# Patient Record
Sex: Female | Born: 1970 | Race: Black or African American | Hispanic: No | Marital: Single | Smoking: Never smoker
Health system: Southern US, Community
[De-identification: ages and names within clinical notes are randomized; demographics above are authoritative.]

## PROBLEM LIST (undated history)

## (undated) DIAGNOSIS — I10 Essential (primary) hypertension: Secondary | ICD-10-CM

## (undated) DIAGNOSIS — D649 Anemia, unspecified: Secondary | ICD-10-CM

## (undated) DIAGNOSIS — E119 Type 2 diabetes mellitus without complications: Secondary | ICD-10-CM

## (undated) HISTORY — PX: ABDOMINAL SURGERY: SHX537

---

## 1998-01-05 ENCOUNTER — Emergency Department (HOSPITAL_COMMUNITY): Admission: EM | Admit: 1998-01-05 | Discharge: 1998-01-05 | Payer: Self-pay | Admitting: Emergency Medicine

## 1998-01-12 ENCOUNTER — Encounter (HOSPITAL_COMMUNITY): Admission: RE | Admit: 1998-01-12 | Discharge: 1998-04-12 | Payer: Self-pay

## 2006-07-24 ENCOUNTER — Other Ambulatory Visit: Admission: RE | Admit: 2006-07-24 | Discharge: 2006-07-24 | Payer: Self-pay | Admitting: Family Medicine

## 2006-11-06 ENCOUNTER — Other Ambulatory Visit (HOSPITAL_COMMUNITY): Admission: RE | Admit: 2006-11-06 | Discharge: 2007-02-04 | Payer: Self-pay | Admitting: Psychiatry

## 2006-11-06 ENCOUNTER — Ambulatory Visit: Payer: Self-pay | Admitting: Psychiatry

## 2012-03-21 ENCOUNTER — Emergency Department (HOSPITAL_BASED_OUTPATIENT_CLINIC_OR_DEPARTMENT_OTHER): Payer: Self-pay

## 2012-03-21 ENCOUNTER — Emergency Department (HOSPITAL_BASED_OUTPATIENT_CLINIC_OR_DEPARTMENT_OTHER)
Admission: EM | Admit: 2012-03-21 | Discharge: 2012-03-21 | Disposition: A | Discharge status: Home / Self Care | Payer: Self-pay | Attending: Emergency Medicine | Admitting: Emergency Medicine

## 2012-03-21 ENCOUNTER — Encounter (HOSPITAL_BASED_OUTPATIENT_CLINIC_OR_DEPARTMENT_OTHER): Payer: Self-pay

## 2012-03-21 DIAGNOSIS — A499 Bacterial infection, unspecified: Secondary | ICD-10-CM | POA: Insufficient documentation

## 2012-03-21 DIAGNOSIS — B9689 Other specified bacterial agents as the cause of diseases classified elsewhere: Secondary | ICD-10-CM | POA: Insufficient documentation

## 2012-03-21 DIAGNOSIS — N760 Acute vaginitis: Secondary | ICD-10-CM | POA: Insufficient documentation

## 2012-03-21 DIAGNOSIS — I10 Essential (primary) hypertension: Secondary | ICD-10-CM | POA: Insufficient documentation

## 2012-03-21 HISTORY — DX: Essential (primary) hypertension: I10

## 2012-03-21 HISTORY — DX: Anemia, unspecified: D64.9

## 2012-03-21 LAB — COMPREHENSIVE METABOLIC PANEL
ALT: 5 U/L (ref 0–35)
Alkaline Phosphatase: 67 U/L (ref 39–117)
CO2: 26 mEq/L (ref 19–32)
Calcium: 9.4 mg/dL (ref 8.4–10.5)
Chloride: 103 mEq/L (ref 96–112)
GFR calc Af Amer: 89 mL/min — ABNORMAL LOW (ref >90)
GFR calc non Af Amer: 77 mL/min — ABNORMAL LOW (ref >90)
Glucose, Bld: 105 mg/dL — ABNORMAL HIGH (ref 70–99)
Sodium: 139 mEq/L (ref 135–145)
Total Bilirubin: 0.2 mg/dL — ABNORMAL LOW (ref 0.3–1.2)

## 2012-03-21 LAB — URINALYSIS, ROUTINE W REFLEX MICROSCOPIC
Glucose, UA: NEGATIVE mg/dL
Hgb urine dipstick: NEGATIVE
Specific Gravity, Urine: 1.028 (ref 1.005–1.030)
Urobilinogen, UA: 0.2 mg/dL (ref 0.0–1.0)

## 2012-03-21 LAB — CBC WITH DIFFERENTIAL/PLATELET
Eosinophils Relative: 1 % (ref 0–5)
HCT: 33.8 % — ABNORMAL LOW (ref 36.0–46.0)
Lymphocytes Relative: 17 % (ref 12–46)
Lymphs Abs: 2.1 10*3/uL (ref 0.7–4.0)
MCV: 74.1 fL — ABNORMAL LOW (ref 78.0–100.0)
Neutro Abs: 9.3 10*3/uL — ABNORMAL HIGH (ref 1.7–7.7)
Platelets: 420 10*3/uL — ABNORMAL HIGH (ref 150–400)
RBC: 4.56 MIL/uL (ref 3.87–5.11)
WBC: 12.5 10*3/uL — ABNORMAL HIGH (ref 4.0–10.5)

## 2012-03-21 LAB — WET PREP, GENITAL: Trich, Wet Prep: NONE SEEN

## 2012-03-21 LAB — URINE MICROSCOPIC-ADD ON

## 2012-03-21 MED ORDER — ONDANSETRON HCL 4 MG/2ML IJ SOLN
4.0000 mg | Freq: Once | INTRAMUSCULAR | Status: AC
  Administered 2012-03-21: 4 mg via INTRAVENOUS
  Filled 2012-03-21: qty 2

## 2012-03-21 MED ORDER — METRONIDAZOLE 500 MG PO TABS: 500.0000 mg | ORAL_TABLET | Freq: Two times a day (BID) | ORAL | Status: AC

## 2012-03-21 MED ORDER — IOHEXOL 300 MG/ML  SOLN: 20.0000 mL | INTRAMUSCULAR | Status: AC

## 2012-03-21 MED ORDER — METRONIDAZOLE 500 MG PO TABS
500.0000 mg | ORAL_TABLET | Freq: Once | ORAL | Status: AC
  Administered 2012-03-21: 500 mg via ORAL
  Filled 2012-03-21: qty 1

## 2012-03-21 MED ORDER — IOHEXOL 300 MG/ML  SOLN
100.0000 mL | Freq: Once | INTRAMUSCULAR | Status: AC | PRN
  Administered 2012-03-21: 100 mL via INTRAVENOUS

## 2012-03-21 NOTE — ED Notes (Signed)
IV attempt x2 without success.

## 2012-03-21 NOTE — ED Notes (Signed)
Patient transported to Ultrasound 

## 2012-03-21 NOTE — ED Notes (Signed)
Pt reports abdominal pain for 6 months and worsening the past week.  She also reports vaginal d/c and odor.

## 2012-03-21 NOTE — ED Notes (Signed)
Given snack and drink  

## 2012-03-22 LAB — GC/CHLAMYDIA PROBE AMP, GENITAL
Chlamydia, DNA Probe: NEGATIVE
GC Probe Amp, Genital: NEGATIVE
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
## 2013-01-19 IMAGING — US US TRANSVAGINAL NON-OB
1 series · 14 of 25 positions shown · non-contrast
Comparison: CT of the abdomen pelvis 03/21/2012

CLINICAL DATA: Lower abdominal tenderness.  CT showing a cervical
and lower vaginal lesion 1 year ago.  History of abnormal Pap
smears. 03/17/2012 LMP



[Series 1: us transvaginal non-ob · 0.30mm/px · 14 of 94 slices shown]
[im 1/94]
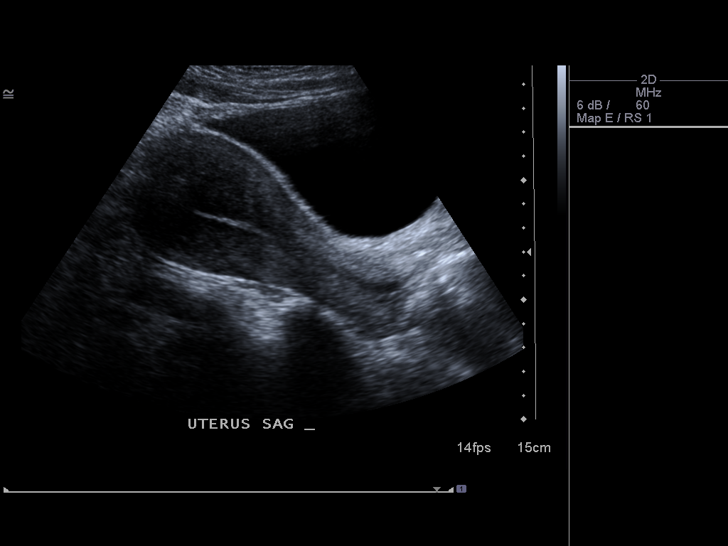
[im 8/94]
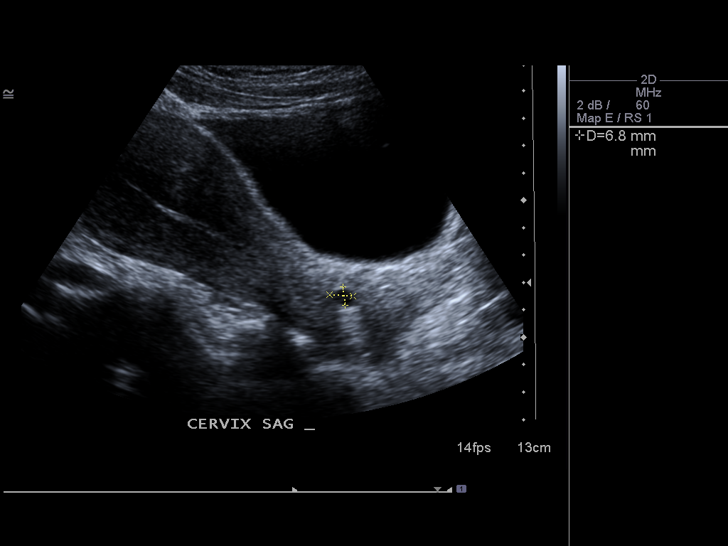
[im 16/94]
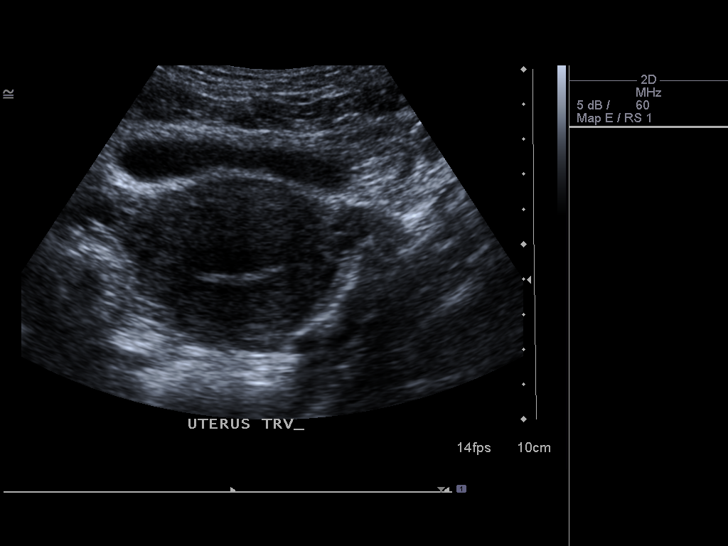
[im 24/94]
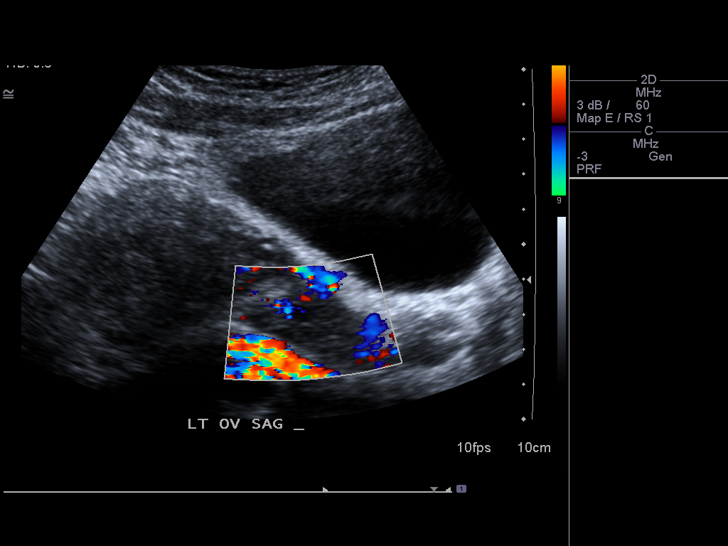
[im 32/94]
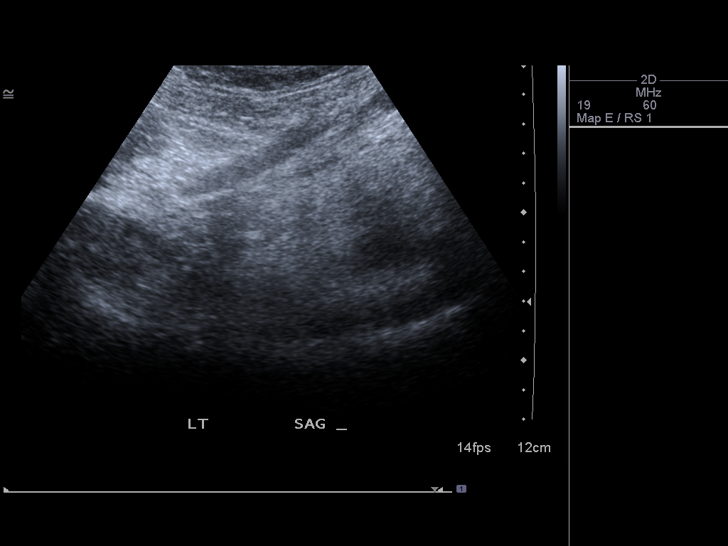
[im 35/94]
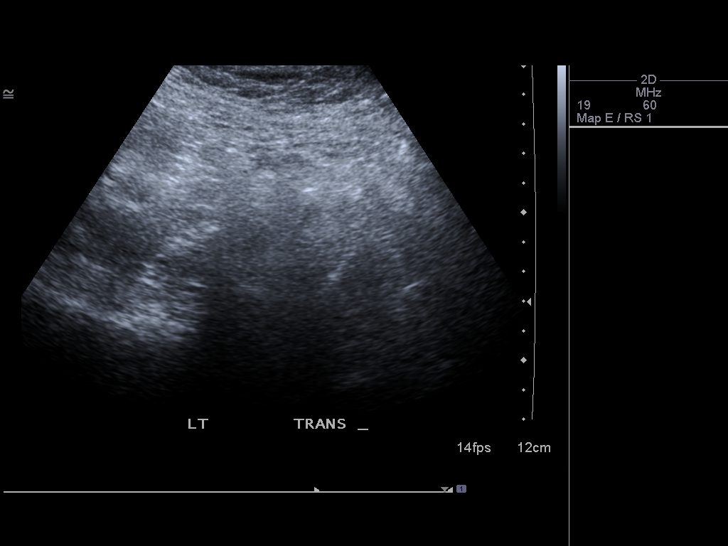
[im 43/94]
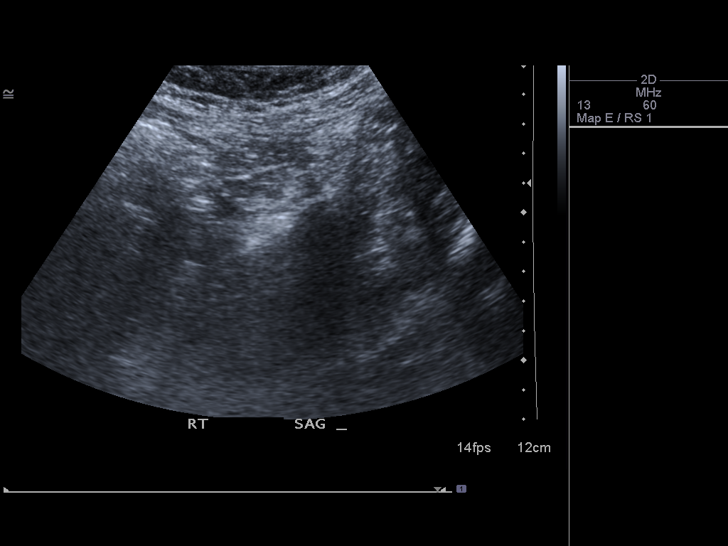
[im 51/94]
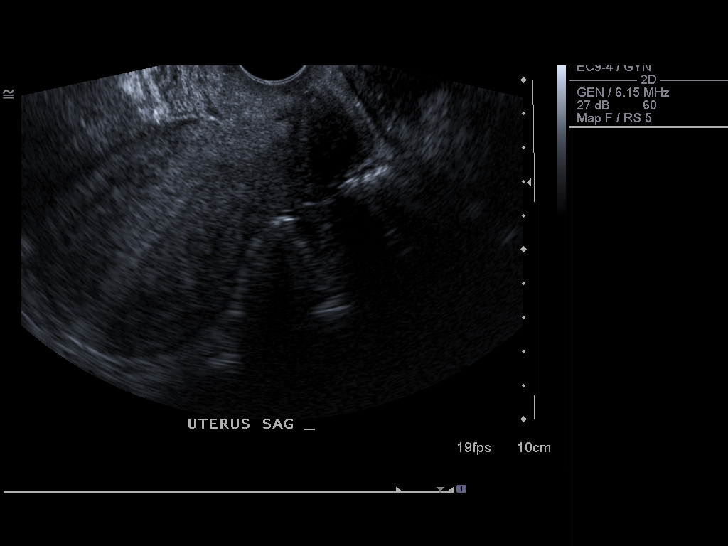
[im 59/94]
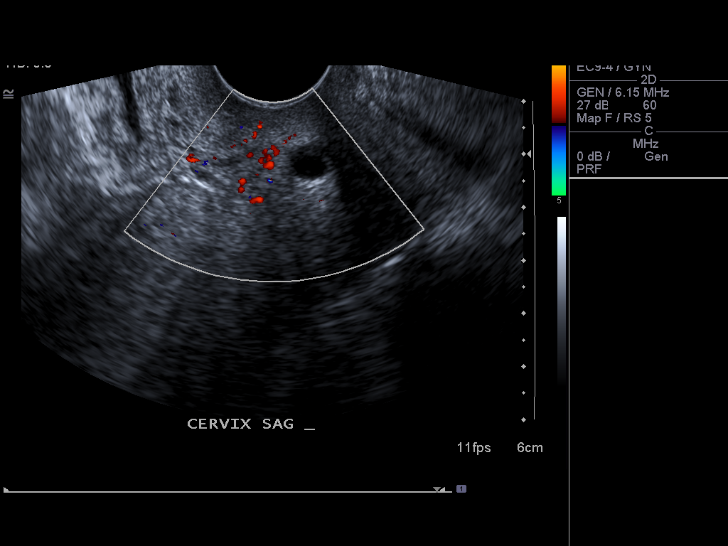
[im 63/94]
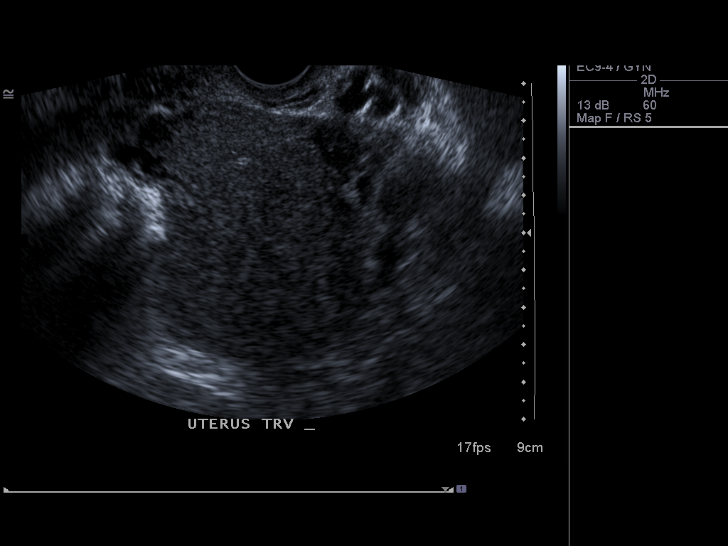
[im 70/94]
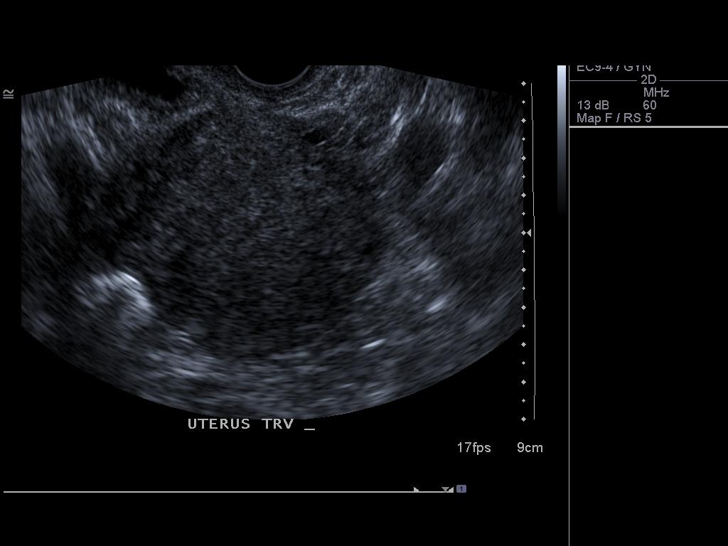
[im 78/94]
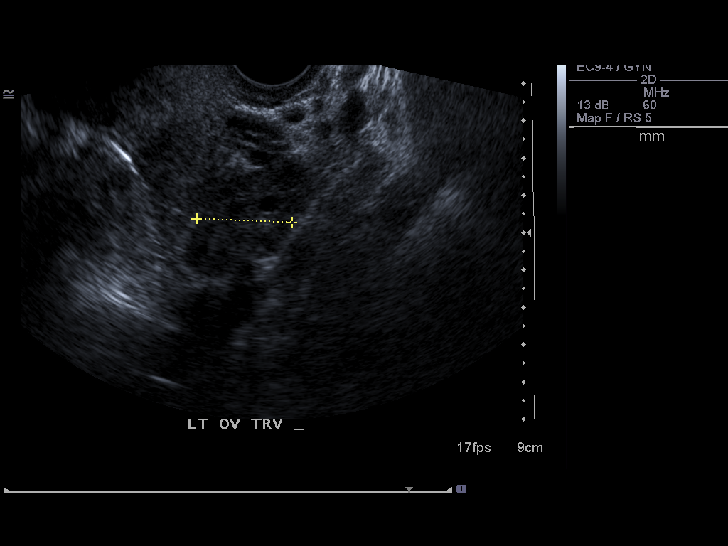
[im 86/94]
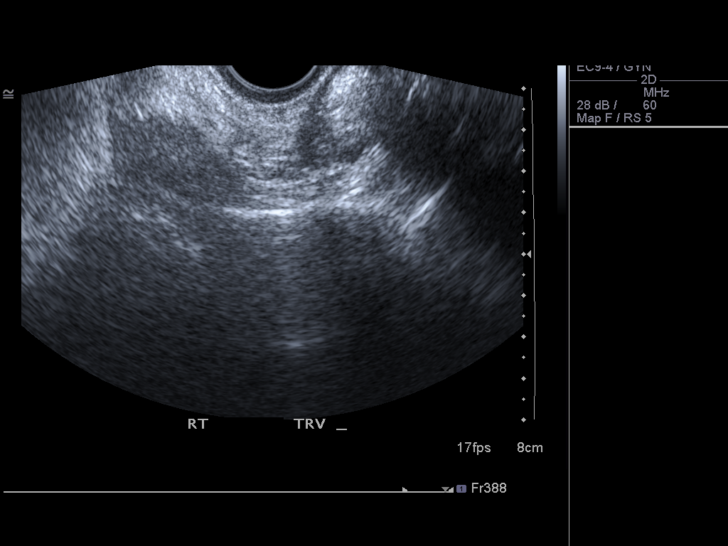
[im 94/94]
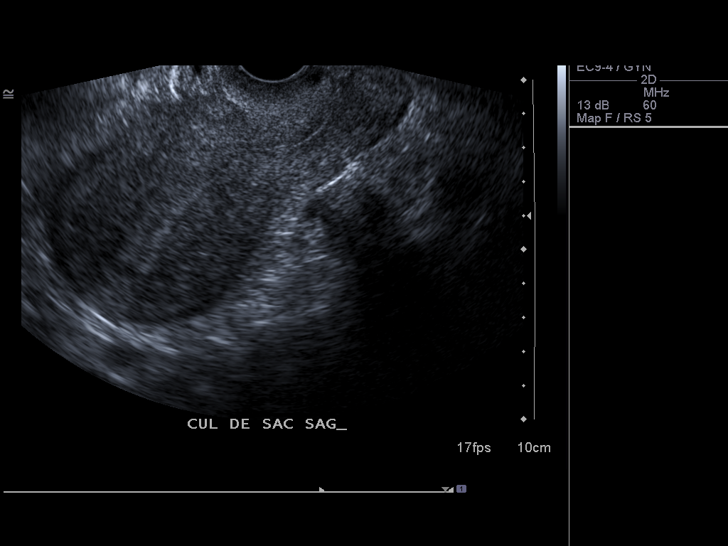

[14 of 25 positions shown; findings below may reference images not displayed]

FINDINGS: Uterus: 11.1 x 5.4 x 6.4 cm.  Normal appearance.  Grossly, the
cervix has a normal appearance with incidental note of a small
Nabothian cyst.

Endometrium: Normal appearance, 1.1 mm in thickness.

Right ovary:  4.2 x 2.1 x 3.4 cm. Normal appearance.

Left ovary: 3.4 x 1.8 x 1.9 cm. Normal appearance.

Other findings: No free fluid
IMPRESSION: 1.  Normal appearance of the uterus/cervix.
2.  Incidental note of benign Nabothian cyst in the cervix.
2.  Normal appearance of both ovaries..

## 2022-03-30 ENCOUNTER — Other Ambulatory Visit: Payer: Self-pay

## 2022-03-30 ENCOUNTER — Emergency Department (HOSPITAL_BASED_OUTPATIENT_CLINIC_OR_DEPARTMENT_OTHER)
Admission: EM | Admit: 2022-03-30 | Discharge: 2022-03-30 | Disposition: A | Discharge status: Home / Self Care | Payer: No Typology Code available for payment source | Attending: Emergency Medicine | Admitting: Emergency Medicine

## 2022-03-30 ENCOUNTER — Encounter (HOSPITAL_BASED_OUTPATIENT_CLINIC_OR_DEPARTMENT_OTHER): Payer: Self-pay

## 2022-03-30 DIAGNOSIS — R739 Hyperglycemia, unspecified: Secondary | ICD-10-CM | POA: Diagnosis not present

## 2022-03-30 DIAGNOSIS — Z79899 Other long term (current) drug therapy: Secondary | ICD-10-CM | POA: Insufficient documentation

## 2022-03-30 DIAGNOSIS — R944 Abnormal results of kidney function studies: Secondary | ICD-10-CM | POA: Diagnosis not present

## 2022-03-30 DIAGNOSIS — D72829 Elevated white blood cell count, unspecified: Secondary | ICD-10-CM | POA: Diagnosis not present

## 2022-03-30 DIAGNOSIS — I10 Essential (primary) hypertension: Secondary | ICD-10-CM | POA: Diagnosis not present

## 2022-03-30 DIAGNOSIS — R519 Headache, unspecified: Secondary | ICD-10-CM | POA: Diagnosis present

## 2022-03-30 LAB — URINALYSIS, MICROSCOPIC (REFLEX)

## 2022-03-30 LAB — CBC WITH DIFFERENTIAL/PLATELET
Abs Immature Granulocytes: 0.11 10*3/uL — ABNORMAL HIGH (ref 0.00–0.07)
Basophils Absolute: 0.1 10*3/uL (ref 0.0–0.1)
Basophils Relative: 0 %
Eosinophils Absolute: 0.4 10*3/uL (ref 0.0–0.5)
Eosinophils Relative: 3 %
HCT: 37.1 % (ref 36.0–46.0)
Hemoglobin: 11.3 g/dL — ABNORMAL LOW (ref 12.0–15.0)
Immature Granulocytes: 1 %
Lymphocytes Relative: 18 %
Lymphs Abs: 2.4 10*3/uL (ref 0.7–4.0)
MCH: 20.9 pg — ABNORMAL LOW (ref 26.0–34.0)
MCHC: 30.5 g/dL (ref 30.0–36.0)
MCV: 68.6 fL — ABNORMAL LOW (ref 80.0–100.0)
Monocytes Absolute: 1 10*3/uL (ref 0.1–1.0)
Monocytes Relative: 7 %
Neutro Abs: 9.9 10*3/uL — ABNORMAL HIGH (ref 1.7–7.7)
Neutrophils Relative %: 71 %
Platelets: 414 10*3/uL — ABNORMAL HIGH (ref 150–400)
RBC: 5.41 MIL/uL — ABNORMAL HIGH (ref 3.87–5.11)
RDW: 17.6 % — ABNORMAL HIGH (ref 11.5–15.5)
WBC: 13.8 10*3/uL — ABNORMAL HIGH (ref 4.0–10.5)
nRBC: 0 % (ref 0.0–0.2)

## 2022-03-30 LAB — COMPREHENSIVE METABOLIC PANEL
ALT: 11 U/L (ref 0–44)
AST: 22 U/L (ref 15–41)
Albumin: 3.7 g/dL (ref 3.5–5.0)
Alkaline Phosphatase: 138 U/L — ABNORMAL HIGH (ref 38–126)
Anion gap: 11 (ref 5–15)
BUN: 21 mg/dL — ABNORMAL HIGH (ref 6–20)
CO2: 24 mmol/L (ref 22–32)
Calcium: 9.2 mg/dL (ref 8.9–10.3)
Chloride: 96 mmol/L — ABNORMAL LOW (ref 98–111)
Creatinine, Ser: 1.22 mg/dL — ABNORMAL HIGH (ref 0.44–1.00)
GFR, Estimated: 53 mL/min — ABNORMAL LOW (ref >60)
Glucose, Bld: 446 mg/dL — ABNORMAL HIGH (ref 70–99)
Potassium: 3.7 mmol/L (ref 3.5–5.1)
Sodium: 131 mmol/L — ABNORMAL LOW (ref 135–145)
Total Bilirubin: 0.4 mg/dL (ref 0.3–1.2)
Total Protein: 8.2 g/dL — ABNORMAL HIGH (ref 6.5–8.1)

## 2022-03-30 LAB — I-STAT VENOUS BLOOD GAS, ED
Acid-Base Excess: 2 mmol/L (ref 0.0–2.0)
Bicarbonate: 26.6 mmol/L (ref 20.0–28.0)
Calcium, Ion: 1.19 mmol/L (ref 1.15–1.40)
HCT: 38 % (ref 36.0–46.0)
Hemoglobin: 12.9 g/dL (ref 12.0–15.0)
O2 Saturation: 98 %
Potassium: 3.8 mmol/L (ref 3.5–5.1)
Sodium: 134 mmol/L — ABNORMAL LOW (ref 135–145)
TCO2: 28 mmol/L (ref 22–32)
pCO2, Ven: 42.1 mmHg — ABNORMAL LOW (ref 44–60)
pH, Ven: 7.409 (ref 7.25–7.43)
pO2, Ven: 106 mmHg — ABNORMAL HIGH (ref 32–45)

## 2022-03-30 LAB — URINALYSIS, ROUTINE W REFLEX MICROSCOPIC
Bilirubin Urine: NEGATIVE
Glucose, UA: >=500 mg/dL — AB
Ketones, ur: NEGATIVE mg/dL
Leukocytes,Ua: NEGATIVE
Nitrite: NEGATIVE
Protein, ur: NEGATIVE mg/dL
Specific Gravity, Urine: 1.015 (ref 1.005–1.030)
pH: 6 (ref 5.0–8.0)

## 2022-03-30 LAB — CBG MONITORING, ED
Glucose-Capillary: 426 mg/dL — ABNORMAL HIGH (ref 70–99)
Glucose-Capillary: 303 mg/dL — ABNORMAL HIGH (ref 70–99)

## 2022-03-30 LAB — BETA-HYDROXYBUTYRIC ACID: Beta-Hydroxybutyric Acid: 0.11 mmol/L (ref 0.05–0.27)

## 2022-03-30 MED ORDER — INSULIN ASPART 100 UNIT/ML IJ SOLN
8.0000 [IU] | Freq: Once | INTRAMUSCULAR | Status: AC
  Administered 2022-03-30: 8 [IU] via SUBCUTANEOUS

## 2022-03-30 MED ORDER — LISINOPRIL 10 MG PO TABS: 10.0000 mg | ORAL_TABLET | Freq: Every day | ORAL | 0 refills | Status: DC

## 2022-03-30 MED ORDER — METFORMIN HCL 500 MG PO TABS: 500.0000 mg | ORAL_TABLET | Freq: Two times a day (BID) | ORAL | 0 refills | Status: DC

## 2022-03-30 MED ORDER — LISINOPRIL 10 MG PO TABS
10.0000 mg | ORAL_TABLET | Freq: Once | ORAL | Status: AC
  Administered 2022-03-30: 10 mg via ORAL
  Filled 2022-03-30: qty 1

## 2022-03-30 MED ORDER — SODIUM CHLORIDE 0.9 % IV BOLUS
1000.0000 mL | Freq: Once | INTRAVENOUS | Status: AC
  Administered 2022-03-30: 1000 mL via INTRAVENOUS

## 2022-03-30 NOTE — Discharge Instructions (Addendum)
You were seen in the emergency department for elevated blood sugar.  Your blood pressure was also elevated.  We are starting you back on your blood pressure medication and add new diabetes medicine.  Please contact your primary care doctor for close follow-up.  Return to the emergency department if any worsening or concerning symptoms.

## 2022-03-30 NOTE — ED Triage Notes (Signed)
Patient states "I havent been feeling well so I used by friends glucometer and it read HI" Patient c/o headaches and abd pain.  BS in triage was 426

## 2022-03-30 NOTE — ED Provider Notes (Signed)
[facility] EMERGENCY DEPARTMENT Provider Note   CSN: [id number] Arrival date & time: 03/30/22  1042     History  Chief Complaint  Patient presents with   Hyperglycemia    Morgan May is a 51 y.o. female.  She has a history of hypertension but has not taken her blood pressure medication in over 3 months.  She has been noticing increased thirst and urinating a lot, getting frequent yeast infections.  She checked her blood sugar and found it to be high.  She said she had gestational diabetes and has a family history of diabetes.  She does not have a primary care doctor.  She has been trying some herbal treatment for managing her weight.  She does endorse having a headache today.  The history is provided by the patient.  Hyperglycemia Blood sugar level PTA:  High Severity:  Severe Onset quality:  Gradual Progression:  Unchanged Chronicity:  New Diabetes status:  Non-diabetic Context: new diabetes diagnosis   Relieved by:  None tried Ineffective treatments:  None tried Associated symptoms: dehydration, fatigue, increased appetite, increased thirst and polyuria   Associated symptoms: no abdominal pain, no chest pain, no dysuria and no shortness of breath   Risk factors: family hx of diabetes        Home Medications Prior to Admission medications   Medication Sig Start Date End Date Taking? Authorizing Provider  lisinopril (PRINIVIL,ZESTRIL) 10 MG tablet Take 10 mg by mouth daily.    [provider]  naproxen sodium (ANAPROX) 220 MG tablet Take 440 mg by mouth 2 (two) times daily with a meal. For pain.    [provider]      Allergies    Patient has no known allergies.    Review of Systems   Review of Systems  Constitutional:  Positive for fatigue.  HENT:  Negative for sore throat.   Eyes:  Negative for visual disturbance.  Respiratory:  Negative for shortness of breath.   Cardiovascular:  Negative for chest pain.  Gastrointestinal:   Negative for abdominal pain.  Endocrine: Positive for polydipsia and polyuria.  Genitourinary:  Negative for dysuria.  Neurological:  Positive for headaches.    Physical Exam Updated Vital Signs BP (!) 209/136 (BP Location: Left Arm)   Pulse (!) 106   Temp 98 F (36.7 C) (Oral)   Resp 17   Ht 6' (1.829 m)   Wt 105.4 kg   LMP 02/22/2022   SpO2 95%   BMI 31.51 kg/m  Physical Exam Vitals and nursing note reviewed.  Constitutional:      General: She is not in acute distress.    Appearance: Normal appearance. She is well-developed.  HENT:     Head: Normocephalic and atraumatic.  Eyes:     Conjunctiva/sclera: Conjunctivae normal.  Cardiovascular:     Rate and Rhythm: Normal rate and regular rhythm.     Heart sounds: No murmur heard. Pulmonary:     Effort: Pulmonary effort is normal. No respiratory distress.     Breath sounds: Normal breath sounds.  Abdominal:     Palpations: Abdomen is soft.     Tenderness: There is no abdominal tenderness. There is no guarding or rebound.  Musculoskeletal:        General: Normal range of motion.     Cervical back: Neck supple.  Skin:    General: Skin is warm and dry.     Capillary Refill: Capillary refill takes less than 2 seconds.  Neurological:  General: No focal deficit present.     Mental Status: She is alert and oriented to person, place, and time.     Cranial Nerves: No cranial nerve deficit.     Sensory: No sensory deficit.     Motor: No weakness.     Gait: Gait normal.     ED Results / Procedures / Treatments   Labs (all labs ordered are listed, but only abnormal results are displayed) Labs Reviewed  COMPREHENSIVE METABOLIC PANEL - Abnormal; Notable for the following components:      Result Value   Sodium 131 (*)    Chloride 96 (*)    Glucose, Bld 446 (*)    BUN 21 (*)    Creatinine, Ser 1.22 (*)    Total Protein 8.2 (*)    Alkaline Phosphatase 138 (*)    GFR, Estimated 53 (*)    All other components within  normal limits  CBC WITH DIFFERENTIAL/PLATELET - Abnormal; Notable for the following components:   WBC 13.8 (*)    RBC 5.41 (*)    Hemoglobin 11.3 (*)    MCV 68.6 (*)    MCH 20.9 (*)    RDW 17.6 (*)    Platelets 414 (*)    Neutro Abs 9.9 (*)    Abs Immature Granulocytes 0.11 (*)    All other components within normal limits  URINALYSIS, ROUTINE W REFLEX MICROSCOPIC - Abnormal; Notable for the following components:   Glucose, UA >=500 (*)    Hgb urine dipstick TRACE (*)    All other components within normal limits  URINALYSIS, MICROSCOPIC (REFLEX) - Abnormal; Notable for the following components:   Bacteria, UA RARE (*)    All other components within normal limits  CBG MONITORING, ED - Abnormal; Notable for the following components:   Glucose-Capillary 426 (*)    All other components within normal limits  I-STAT VENOUS BLOOD GAS, ED - Abnormal; Notable for the following components:   pCO2, Ven 42.1 (*)    pO2, Ven 106 (*)    Sodium 134 (*)    All other components within normal limits  CBG MONITORING, ED - Abnormal; Notable for the following components:   Glucose-Capillary 303 (*)    All other components within normal limits  BETA-HYDROXYBUTYRIC ACID    EKG EKG Interpretation  Date/Time:  Thursday March 30 2022 11:33:53 EDT Ventricular Rate:  90 PR Interval:  141 QRS Duration: 94 QT Interval:  363 QTC Calculation: 445 R Axis:   42 Text Interpretation: Sinus rhythm Probable left atrial enlargement RSR' in V1 or V2, probably normal variant No old tracing to compare Confirmed by Aletta Edouard [phone]) on 03/30/2022 11:35:43 AM  Radiology No results found.  Procedures Procedures    Medications Ordered in ED Medications  sodium chloride 0.9 % bolus 1,000 mL (0 mLs Intravenous Stopped 03/30/22 1431)  lisinopril (ZESTRIL) tablet 10 mg (10 mg Oral Given 03/30/22 1213)  insulin aspart (novoLOG) injection 8 Units (8 Units Subcutaneous Given 03/30/22 1213)    ED Course/  Medical Decision Making/ A&P Clinical Course as of 03/30/22 1701  Thu Mar 30, 2022  1434 Patient's blood sugars improved to 303.  Blood pressure is also come down nicely.  We will start her back on her lisinopril and add metformin.  She understands she needs to schedule a PCP appointment for further management. [MB]    Clinical Course User Index [MB] Hayden Rasmussen, MD  Medical Decision Making Amount and/or Complexity of Data Reviewed Labs: ordered.  Risk Prescription drug management.   This patient complains of elevated blood sugar elevated blood pressure; this involves an extensive number of treatment Options and is a complaint that carries with it a high risk of complications and morbidity. The differential includes diabetes, DKA, hypertension, hypertensive emergency, renal failure, metabolic derangement  I ordered, reviewed and interpreted labs, which included CBC with mildly elevated white count, hemoglobin low better than priors, chemistries with elevated glucose elevated BUN and creatinine reflecting new onset diabetes with dehydration, low sodium consistent with pseudohyponatremia, normal pH, urinalysis without ketones or infection I ordered medication IV fluids subcu insulin and oral blood pressure medication and reviewed PMP when indicated. Previous records obtained and reviewed in epic no recent admissions  Cardiac monitoring reviewed, normal sinus rhythm Social determinants considered, no significant barriers Critical Interventions: None  After the interventions stated above, I reevaluated the patient and found patient to be symptomatically improved, blood pressure trending down and blood sugar trending down.  Admission and further testing considered, no indications for admission or further work-up at this time.  Patient understands to establish care with PCP and will fill prescriptions for blood pressure and hyperglycemia.  Return instructions  discussed         Final Clinical Impression(s) / ED Diagnoses Final diagnoses:  Hyperglycemia  Essential hypertension    Rx / DC Orders ED Discharge Orders          Ordered    lisinopril (ZESTRIL) 10 MG tablet  Daily        03/30/22 1436    metFORMIN (GLUCOPHAGE) 500 MG tablet  2 times daily with meals        03/30/22 1436              Hayden Rasmussen, MD 03/30/22 [phone]

## 2022-08-27 ENCOUNTER — Encounter (HOSPITAL_BASED_OUTPATIENT_CLINIC_OR_DEPARTMENT_OTHER): Payer: Self-pay | Admitting: Emergency Medicine

## 2022-08-27 ENCOUNTER — Emergency Department (HOSPITAL_BASED_OUTPATIENT_CLINIC_OR_DEPARTMENT_OTHER): Payer: Medicaid Other

## 2022-08-27 ENCOUNTER — Other Ambulatory Visit: Payer: Self-pay

## 2022-08-27 ENCOUNTER — Inpatient Hospital Stay (HOSPITAL_BASED_OUTPATIENT_CLINIC_OR_DEPARTMENT_OTHER)
Admission: EM | Admit: 2022-08-27 | Discharge: 2022-08-29 | DRG: 312 | Disposition: A | Discharge status: Home / Self Care | Payer: Medicaid Other | Attending: Internal Medicine | Admitting: Internal Medicine

## 2022-08-27 DIAGNOSIS — Z7984 Long term (current) use of oral hypoglycemic drugs: Secondary | ICD-10-CM

## 2022-08-27 DIAGNOSIS — E871 Hypo-osmolality and hyponatremia: Secondary | ICD-10-CM | POA: Diagnosis present

## 2022-08-27 DIAGNOSIS — R7989 Other specified abnormal findings of blood chemistry: Secondary | ICD-10-CM | POA: Diagnosis present

## 2022-08-27 DIAGNOSIS — R079 Chest pain, unspecified: Principal | ICD-10-CM

## 2022-08-27 DIAGNOSIS — N179 Acute kidney failure, unspecified: Secondary | ICD-10-CM | POA: Diagnosis present

## 2022-08-27 DIAGNOSIS — K5900 Constipation, unspecified: Secondary | ICD-10-CM | POA: Diagnosis present

## 2022-08-27 DIAGNOSIS — Z1152 Encounter for screening for COVID-19: Secondary | ICD-10-CM | POA: Diagnosis not present

## 2022-08-27 DIAGNOSIS — E785 Hyperlipidemia, unspecified: Secondary | ICD-10-CM

## 2022-08-27 DIAGNOSIS — E860 Dehydration: Secondary | ICD-10-CM | POA: Diagnosis present

## 2022-08-27 DIAGNOSIS — E1165 Type 2 diabetes mellitus with hyperglycemia: Secondary | ICD-10-CM | POA: Diagnosis not present

## 2022-08-27 DIAGNOSIS — D259 Leiomyoma of uterus, unspecified: Secondary | ICD-10-CM | POA: Diagnosis present

## 2022-08-27 DIAGNOSIS — E669 Obesity, unspecified: Secondary | ICD-10-CM | POA: Diagnosis present

## 2022-08-27 DIAGNOSIS — R1011 Right upper quadrant pain: Secondary | ICD-10-CM

## 2022-08-27 DIAGNOSIS — R55 Syncope and collapse: Secondary | ICD-10-CM | POA: Diagnosis present

## 2022-08-27 DIAGNOSIS — I10 Essential (primary) hypertension: Secondary | ICD-10-CM

## 2022-08-27 DIAGNOSIS — R6881 Early satiety: Secondary | ICD-10-CM | POA: Diagnosis present

## 2022-08-27 DIAGNOSIS — F419 Anxiety disorder, unspecified: Secondary | ICD-10-CM

## 2022-08-27 DIAGNOSIS — F319 Bipolar disorder, unspecified: Secondary | ICD-10-CM | POA: Diagnosis present

## 2022-08-27 DIAGNOSIS — K819 Cholecystitis, unspecified: Secondary | ICD-10-CM | POA: Diagnosis present

## 2022-08-27 DIAGNOSIS — Z9851 Tubal ligation status: Secondary | ICD-10-CM

## 2022-08-27 DIAGNOSIS — Z79899 Other long term (current) drug therapy: Secondary | ICD-10-CM

## 2022-08-27 DIAGNOSIS — K279 Peptic ulcer, site unspecified, unspecified as acute or chronic, without hemorrhage or perforation: Secondary | ICD-10-CM | POA: Diagnosis present

## 2022-08-27 DIAGNOSIS — R5381 Other malaise: Secondary | ICD-10-CM | POA: Diagnosis present

## 2022-08-27 DIAGNOSIS — Z6832 Body mass index (BMI) 32.0-32.9, adult: Secondary | ICD-10-CM

## 2022-08-27 DIAGNOSIS — E119 Type 2 diabetes mellitus without complications: Secondary | ICD-10-CM | POA: Diagnosis present

## 2022-08-27 DIAGNOSIS — K760 Fatty (change of) liver, not elsewhere classified: Secondary | ICD-10-CM | POA: Diagnosis present

## 2022-08-27 DIAGNOSIS — R109 Unspecified abdominal pain: Secondary | ICD-10-CM

## 2022-08-27 HISTORY — DX: Type 2 diabetes mellitus without complications: E11.9

## 2022-08-27 LAB — RESP PANEL BY RT-PCR (RSV, FLU A&B, COVID)  RVPGX2
Influenza A by PCR: NEGATIVE
Influenza B by PCR: NEGATIVE
Resp Syncytial Virus by PCR: NEGATIVE
SARS Coronavirus 2 by RT PCR: NEGATIVE

## 2022-08-27 LAB — BASIC METABOLIC PANEL
Anion gap: 8 (ref 5–15)
BUN: 43 mg/dL — ABNORMAL HIGH (ref 6–20)
CO2: 24 mmol/L (ref 22–32)
Calcium: 8.7 mg/dL — ABNORMAL LOW (ref 8.9–10.3)
Chloride: 100 mmol/L (ref 98–111)
Creatinine, Ser: 2.15 mg/dL — ABNORMAL HIGH (ref 0.44–1.00)
GFR, Estimated: 27 mL/min — ABNORMAL LOW (ref >60)
Glucose, Bld: 169 mg/dL — ABNORMAL HIGH (ref 70–99)
Potassium: 3.5 mmol/L (ref 3.5–5.1)
Sodium: 132 mmol/L — ABNORMAL LOW (ref 135–145)

## 2022-08-27 LAB — URINALYSIS, ROUTINE W REFLEX MICROSCOPIC
Bacteria, UA: NONE SEEN
Bilirubin Urine: NEGATIVE
Glucose, UA: NEGATIVE mg/dL
Ketones, ur: NEGATIVE mg/dL
Nitrite: NEGATIVE
Protein, ur: NEGATIVE mg/dL
Specific Gravity, Urine: 1.012 (ref 1.005–1.030)
pH: 5 (ref 5.0–8.0)

## 2022-08-27 LAB — CBC
HCT: 41.2 % (ref 36.0–46.0)
Hemoglobin: 12.5 g/dL (ref 12.0–15.0)
MCH: 22.7 pg — ABNORMAL LOW (ref 26.0–34.0)
MCHC: 30.3 g/dL (ref 30.0–36.0)
MCV: 74.8 fL — ABNORMAL LOW (ref 80.0–100.0)
Platelets: 346 10*3/uL (ref 150–400)
RBC: 5.51 MIL/uL — ABNORMAL HIGH (ref 3.87–5.11)
RDW: 19 % — ABNORMAL HIGH (ref 11.5–15.5)
WBC: 14.2 10*3/uL — ABNORMAL HIGH (ref 4.0–10.5)
nRBC: 0 % (ref 0.0–0.2)

## 2022-08-27 LAB — OCCULT BLOOD X 1 CARD TO LAB, STOOL: Fecal Occult Bld: NEGATIVE

## 2022-08-27 LAB — PROTIME-INR
INR: 0.9 (ref 0.8–1.2)
Prothrombin Time: 11.9 seconds (ref 11.4–15.2)

## 2022-08-27 LAB — CBG MONITORING, ED
Glucose-Capillary: 87 mg/dL (ref 70–99)
Glucose-Capillary: 81 mg/dL (ref 70–99)

## 2022-08-27 LAB — TROPONIN I (HIGH SENSITIVITY): Troponin I (High Sensitivity): 11 ng/L (ref <18)

## 2022-08-27 LAB — LIPASE, BLOOD: Lipase: 43 U/L (ref 11–51)

## 2022-08-27 LAB — GLUCOSE, CAPILLARY
Glucose-Capillary: 115 mg/dL — ABNORMAL HIGH (ref 70–99)
Glucose-Capillary: 120 mg/dL — ABNORMAL HIGH (ref 70–99)

## 2022-08-27 LAB — HEPATIC FUNCTION PANEL
ALT: 12 U/L (ref 0–44)
AST: 26 U/L (ref 15–41)
Albumin: 3.7 g/dL (ref 3.5–5.0)
Alkaline Phosphatase: 86 U/L (ref 38–126)
Bilirubin, Direct: 0.1 mg/dL (ref 0.0–0.2)
Indirect Bilirubin: 0.4 mg/dL (ref 0.3–0.9)
Total Bilirubin: 0.5 mg/dL (ref 0.3–1.2)
Total Protein: 7.9 g/dL (ref 6.5–8.1)

## 2022-08-27 LAB — HEPARIN LEVEL (UNFRACTIONATED): Heparin Unfractionated: 0.43 IU/mL (ref 0.30–0.70)

## 2022-08-27 LAB — D-DIMER, QUANTITATIVE: D-Dimer, Quant: 0.52 ug/mL-FEU — ABNORMAL HIGH (ref 0.00–0.50)

## 2022-08-27 LAB — HEMOGLOBIN A1C
Hgb A1c MFr Bld: 6.9 % — ABNORMAL HIGH (ref 4.8–5.6)
Mean Plasma Glucose: 151.33 mg/dL

## 2022-08-27 MED ORDER — NITROGLYCERIN 0.4 MG SL SUBL: 0.4000 mg | SUBLINGUAL_TABLET | SUBLINGUAL | Status: DC | PRN

## 2022-08-27 MED ORDER — METOPROLOL TARTRATE 5 MG/5ML IV SOLN: 5.0000 mg | INTRAVENOUS | Status: DC | PRN

## 2022-08-27 MED ORDER — HYDROMORPHONE HCL 1 MG/ML IJ SOLN
0.5000 mg | Freq: Once | INTRAMUSCULAR | Status: AC
  Administered 2022-08-27: 0.5 mg via INTRAVENOUS
  Filled 2022-08-27: qty 1

## 2022-08-27 MED ORDER — ONDANSETRON HCL 4 MG/2ML IJ SOLN
4.0000 mg | Freq: Four times a day (QID) | INTRAMUSCULAR | Status: DC | PRN
  Administered 2022-08-27: 4 mg via INTRAVENOUS
  Filled 2022-08-27: qty 2

## 2022-08-27 MED ORDER — INSULIN ASPART 100 UNIT/ML IJ SOLN: 0.0000 [IU] | Freq: Every day | INTRAMUSCULAR | Status: DC

## 2022-08-27 MED ORDER — HYDROMORPHONE HCL 1 MG/ML IJ SOLN
1.0000 mg | Freq: Once | INTRAMUSCULAR | Status: AC
  Administered 2022-08-27: 1 mg via INTRAVENOUS
  Filled 2022-08-27: qty 1

## 2022-08-27 MED ORDER — QUETIAPINE FUMARATE 25 MG PO TABS
25.0000 mg | ORAL_TABLET | Freq: Once | ORAL | Status: AC
  Administered 2022-08-27: 25 mg via ORAL
  Filled 2022-08-27: qty 1

## 2022-08-27 MED ORDER — HEPARIN BOLUS VIA INFUSION
5000.0000 [IU] | Freq: Once | INTRAVENOUS | Status: AC
  Administered 2022-08-27: 5000 [IU] via INTRAVENOUS

## 2022-08-27 MED ORDER — SODIUM CHLORIDE 0.9 % IV BOLUS
1000.0000 mL | Freq: Once | INTRAVENOUS | Status: AC
  Administered 2022-08-27: 1000 mL via INTRAVENOUS

## 2022-08-27 MED ORDER — INSULIN ASPART 100 UNIT/ML IJ SOLN
0.0000 [IU] | Freq: Three times a day (TID) | INTRAMUSCULAR | Status: DC
  Administered 2022-08-28: 2 [IU] via SUBCUTANEOUS

## 2022-08-27 MED ORDER — HYDROMORPHONE HCL 1 MG/ML IJ SOLN: 0.5000 mg | INTRAMUSCULAR | Status: DC | PRN

## 2022-08-27 MED ORDER — SODIUM CHLORIDE 0.9 % IV SOLN: INTRAVENOUS | Status: DC

## 2022-08-27 MED ORDER — MORPHINE SULFATE (PF) 2 MG/ML IV SOLN
1.0000 mg | INTRAVENOUS | Status: DC | PRN
  Administered 2022-08-27 (×2): 1 mg via INTRAVENOUS
  Filled 2022-08-27 (×2): qty 1

## 2022-08-27 MED ORDER — HEPARIN (PORCINE) 25000 UT/250ML-% IV SOLN
1700.0000 [IU]/h | INTRAVENOUS | Status: DC
  Administered 2022-08-27 – 2022-08-29 (×3): 1700 [IU]/h via INTRAVENOUS
  Filled 2022-08-27 (×4): qty 250

## 2022-08-27 NOTE — ED Notes (Signed)
Called [facility] for transport at 10:52.

## 2022-08-27 NOTE — Progress Notes (Signed)
ANTICOAGULATION CONSULT NOTE  Pharmacy Consult for Heparin Indication: possible pulmonary embolism  No Known Allergies  Patient Measurements: Height: 5' 11"$  (180.3 cm) Weight: 104.3 kg (230 lb) IBW/kg (Calculated) : 70.8 Heparin Dosing Weight: 95 kg  Vital Signs: Temp: 97.5 F (36.4 C) (02/11 1000) Temp Source: Oral (02/11 1000) BP: 139/79 (02/11 1230) Pulse Rate: 59 (02/11 1230)  Labs: Recent Labs    08/27/22 0152 08/27/22 1217  HGB 12.5  --   HCT 41.2  --   PLT 346  --   LABPROT 11.9  --   INR 0.9  --   HEPARINUNFRC  --  0.43  CREATININE 2.15*  --   TROPONINIHS 11  --      Estimated Creatinine Clearance: 40.2 mL/min (A) (by C-G formula based on SCr of 2.15 mg/dL (H)).   Medical History: Past Medical History:  Diagnosis Date   Anemia    Diabetes mellitus without complication ([place])    Hypertension     Medications:  No current facility-administered medications on file prior to encounter.   Current Outpatient Medications on File Prior to Encounter  Medication Sig Dispense Refill   lisinopril (ZESTRIL) 10 MG tablet Take 1 tablet (10 mg total) by mouth daily. 90 tablet 0   metFORMIN (GLUCOPHAGE) 500 MG tablet Take 1 tablet (500 mg total) by mouth 2 (two) times daily with a meal. 180 tablet 0   naproxen sodium (ANAPROX) 220 MG tablet Take 440 mg by mouth 2 (two) times daily with a meal. For pain.       Assessment: 52 y.o. female with CP and SOB, awaiting VQ scan for PE rule out, heparin level therapeutic initially on 1700 units/hr  Goal of Therapy:  Heparin level 0.3-0.7 units/ml Monitor platelets by anticoagulation protocol: Yes   Plan:  Continue heparin gtt at 1700 units/hr Daily heparin level, CBC, s/s bleeding F/u VQ scan and PE workup/AC plan  Bertis Ruddy, PharmD, [facility] Pharmacist ED Pharmacist Phone # [phone] 08/27/2022 2:17 PM

## 2022-08-27 NOTE — Assessment & Plan Note (Signed)
-   A1C of 6.9 -keep on SSI

## 2022-08-27 NOTE — Assessment & Plan Note (Signed)
-  mildly elevated due to pain -Hold home ACE and thiazide due to AKI

## 2022-08-27 NOTE — Progress Notes (Signed)
ANTICOAGULATION CONSULT NOTE - Initial Consult  Pharmacy Consult for Heparin Indication: possible pulmonary embolism  No Known Allergies  Patient Measurements: Height: 5' 11"$  (180.3 cm) Weight: 104.3 kg (230 lb) IBW/kg (Calculated) : 70.8 Heparin Dosing Weight: 95 kg  Vital Signs: Temp: 97.8 F (36.6 C) (02/11 0119) Temp Source: Oral (02/11 0119) BP: 145/83 (02/11 0119) Pulse Rate: 70 (02/11 0119)  Labs: Recent Labs    08/27/22 0152  HGB 12.5  HCT 41.2  PLT 346  LABPROT 11.9  INR 0.9  CREATININE 2.15*  TROPONINIHS 11    Estimated Creatinine Clearance: 40.2 mL/min (A) (by C-G formula based on SCr of 2.15 mg/dL (H)).   Medical History: Past Medical History:  Diagnosis Date   Anemia    Diabetes mellitus without complication ([place])    Hypertension     Medications:  No current facility-administered medications on file prior to encounter.   Current Outpatient Medications on File Prior to Encounter  Medication Sig Dispense Refill   lisinopril (ZESTRIL) 10 MG tablet Take 1 tablet (10 mg total) by mouth daily. 90 tablet 0   metFORMIN (GLUCOPHAGE) 500 MG tablet Take 1 tablet (500 mg total) by mouth 2 (two) times daily with a meal. 180 tablet 0   naproxen sodium (ANAPROX) 220 MG tablet Take 440 mg by mouth 2 (two) times daily with a meal. For pain.       Assessment: 52 y.o. female with chest pain /shortness of breath, possible PE, for heparin  Goal of Therapy:  Heparin level 0.3-0.7 units/ml Monitor platelets by anticoagulation protocol: Yes   Plan:  Heparin 5000 units IV bolus, then start heparin 1700 units/hr Check heparin level in 6 hours.   Morgan May 08/27/2022,4:23 AM

## 2022-08-27 NOTE — Progress Notes (Signed)
This is a send 52 year old female with past medical history of diabetes mellitus, hypertension, hyperlipidemia and bipolar disease.  The patient has been having chest pain and shortness of breath for the past 2 days.  Her chest pain is centrally located occasionally radiating to the back.  It is pleuritic in nature.  2 days ago she had a syncopal event at work related to the same the symptoms.  There is no reports of any fevers or chills or cough or infectious characteristics.  She went to [employer].  There her EKG was normal sinus rhythm.  White blood count elevated 14.2 but this appears chronic.  Her creatinine 2.15 up from 1.22 (03/30/2022).  GFR of 27. For this reason CTA pulmonary was not done.  CT chest without contrast was unrevealing.  Troponin normal.  Patient be sent over for VQ scan to rule out PE.  D-dimer remains pending.  Patient started on heparin drip by EDP

## 2022-08-27 NOTE — Assessment & Plan Note (Signed)
-  suspect vasovagal from her abdominal pain and dehydration -will keep on telemetry overnight to monitor

## 2022-08-27 NOTE — ED Triage Notes (Signed)
Patient arrived via POV c/o chest pain w/ shortness of breath, back pain and general malaise. Patient states she had syncopal episode 2 days prior to arrival.  Patient seen by EMS and scene, decided not to come to ED. Patient is AO x 4, VS WDL, normal gait.

## 2022-08-27 NOTE — Assessment & Plan Note (Addendum)
-  pt initially presented as a transfer from [facility] ED with chest pain concern due secondary to pulmonary embolism with mildly elevated D-dimer of 0.52. However pain she describes is more RUQ abdominal pain with radiation to her back with symptoms of early satiety, constipation and possible melena. Also recently had first dose of Ozempic. Her source of pain more like GI in origin including pancreatitis, cholecystitis, dyspepsia, peptic ulcer.  -check LFTs, Lipase and RUQ ultrasound  -also obtain FOBT. Hgb stable at 12.5. She reports hx of equiring blood transfusion several years ago with no cause found on endoscopy or colonoscopy and she did not follow up with capsule endoscopy -However still cannot completely rule out pulmonary embolism without imaging and she cannot receive contrast due to AKI. With PE being low on the differential I think we can wait on repeat labs to assess her renal function tomorrow after IV fluids tonight so we can get a more definitive answer with a CTA Chest rather than doing a V/Q scan overnight. Her reports of possible melanotic stool is uncertain and her current Hgb is stable so would continue to keep her on IV heparin infusion started in ED until definitive imaging tomorrow or if her other lab work gives clear source of her symptoms.

## 2022-08-27 NOTE — Progress Notes (Signed)
Patient significant other brought patient home medication, RN noticed that some medication are not on patient MAR. Patient would like to take her Quetiapine 25 mg. RN called pharmacy to reconcile the medication but not until tomorrow morning. MD made aware and order one dose of quetiapine 25 mg.

## 2022-08-27 NOTE — Assessment & Plan Note (Signed)
-  reatinine of 2.15 from prior of 1.22.   -Urine also had clumping substance the past two days that I suspect could just be hyaline case rather than clots. Will obtain UA.

## 2022-08-27 NOTE — Assessment & Plan Note (Signed)
-  Continue nighttime Seroquel

## 2022-08-27 NOTE — Assessment & Plan Note (Signed)
-  Mild with Na of 132 -follow repeat after IV fluid overnight

## 2022-08-27 NOTE — ED Provider Notes (Signed)
[facility] Emergency Department Provider Note MRN:  [id number]  Arrival date & time: 08/27/22     Chief Complaint   Chest Pain and Shortness of Breath   History of Present Illness   Morgan May is a 52 y.o. year-old female with a history of hypertension, diabetes presenting to the ED with chief complaint of chest pain and shortness of breath.  Patient felt unwell with malaise, subjective fever 2 days ago, had a syncopal episode at work for which EMS was called and she deferred transport to a hospital.  Since then she has had continued chest pain and shortness of breath.  The pain radiates to the thoracic back.  Pain associated with dizziness, diaphoresis, nausea.  Feels short of breath.  Review of Systems  A thorough review of systems was obtained and all systems are negative except as noted in the HPI and PMH.   Patient's Health History    Past Medical History:  Diagnosis Date   Anemia    Diabetes mellitus without complication ([place])    Hypertension     Past Surgical History:  Procedure Laterality Date   ABDOMINAL SURGERY      History reviewed. No pertinent family history.  Social History   Socioeconomic History   Marital status: Single    Spouse name: Not on file   Number of children: Not on file   Years of education: Not on file   Highest education level: Not on file  Occupational History   Not on file  Tobacco Use   Smoking status: Never   Smokeless tobacco: Never  Vaping Use   Vaping Use: Never used  Substance and Sexual Activity   Alcohol use: Yes    Comment: rarely   Drug use: No   Sexual activity: Not on file  Other Topics Concern   Not on file  Social History Narrative   Not on file   Social Determinants of Health   Financial Resource Strain: Not on file  Food Insecurity: Not on file  Transportation Needs: Not on file  Physical Activity: Not on file  Stress: Not on file  Social Connections: Not on file   Intimate Partner Violence: Not on file     Physical Exam   Vitals:   08/27/22 0119  BP: (!) 145/83  Pulse: 70  Resp: 18  Temp: 97.8 F (36.6 C)  SpO2: 95%    CONSTITUTIONAL: Well-appearing, NAD NEURO/PSYCH:  Alert and oriented x 3, no focal deficits EYES:  eyes equal and reactive ENT/NECK:  no LAD, no JVD CARDIO: Regular rate, well-perfused, normal S1 and S2 PULM:  CTAB no wheezing or rhonchi GI/GU:  non-distended, non-tender MSK/SPINE:  No gross deformities, no edema SKIN:  no rash, atraumatic   *Additional and/or pertinent findings included in MDM below  Diagnostic and Interventional Summary    EKG Interpretation  Date/Time:  Sunday August 27 2022 01:35:10 EST Ventricular Rate:  67 PR Interval:  156 QRS Duration: 94 QT Interval:  435 QTC Calculation: 460 R Axis:   70 Text Interpretation: Sinus rhythm Consider left ventricular hypertrophy Confirmed by Gerlene Fee [phone]) on 08/27/2022 1:45:19 AM       Labs Reviewed  BASIC METABOLIC PANEL - Abnormal; Notable for the following components:      Result Value   Sodium 132 (*)    Glucose, Bld 169 (*)    BUN 43 (*)    Creatinine, Ser 2.15 (*)    Calcium 8.7 (*)  GFR, Estimated 27 (*)    All other components within normal limits  CBC - Abnormal; Notable for the following components:   WBC 14.2 (*)    RBC 5.51 (*)    MCV 74.8 (*)    MCH 22.7 (*)    RDW 19.0 (*)    All other components within normal limits  RESP PANEL BY RT-PCR (RSV, FLU A&B, COVID)  RVPGX2  PROTIME-INR  TROPONIN I (HIGH SENSITIVITY)  TROPONIN I (HIGH SENSITIVITY)    CT Chest Wo Contrast  Final Result    CT HEAD WO CONTRAST (5MM)  Final Result    DG Chest 2 View  Final Result      Medications  sodium chloride 0.9 % bolus 1,000 mL (1,000 mLs Intravenous New Bag/Given 08/27/22 0341)  HYDROmorphone (DILAUDID) injection 0.5 mg (0.5 mg Intravenous Given 08/27/22 [id number])     Procedures  /  Critical Care Procedures  ED Course and  Medical Decision Making  Initial Impression and Ddx Differential diagnosis includes ACS, PE, dissection considered but felt to be less likely.  Pneumonia, COVID, flu considered.  Past medical/surgical history that increases complexity of ED encounter: Hypertension, diabetes  Interpretation of Diagnostics I personally reviewed the EKG and my interpretation is as follows: Sinus rhythm without concerning ischemic features  Labs reveal acute kidney injury, mild hyponatremia.  Patient Reassessment and Ultimate Disposition/Management     Unable to obtain CTA due to renal function.  CT Noncon is without explanation for patient's chest pain, no signs of pneumonia.  Given the pleuritic nature of patient's pain, the syncopal episode, and the lack of other possible causes, will treat empirically for PE.  Will admit for further diagnostics and for the acute kidney injury.  Patient management required discussion with the following services or consulting groups:  Hospitalist Service  Complexity of Problems Addressed Acute illness or injury that poses threat of life of bodily function  Additional Data Reviewed and Analyzed Further history obtained from: Prior labs/imaging results  Additional Factors Impacting ED Encounter Risk Consideration of hospitalization  Barth Kirks. [place]onia Small, [facility] [email or web address]  Final Clinical Impressions(s) / ED Diagnoses     ICD-10-CM   1. Chest pain, unspecified type  R07.9     2. AKI (acute kidney injury) ([place])  N17.9       ED Discharge Orders     None        Discharge Instructions Discussed with and Provided to Patient:   Discharge Instructions   None      Maudie Flakes, MD 08/27/22 [phone]
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
## 2022-08-27 NOTE — Assessment & Plan Note (Signed)
Continue statin. 

## 2022-08-28 ENCOUNTER — Inpatient Hospital Stay (HOSPITAL_COMMUNITY): Payer: Medicaid Other

## 2022-08-28 DIAGNOSIS — R079 Chest pain, unspecified: Secondary | ICD-10-CM

## 2022-08-28 DIAGNOSIS — N179 Acute kidney failure, unspecified: Secondary | ICD-10-CM | POA: Diagnosis not present

## 2022-08-28 LAB — BASIC METABOLIC PANEL
Anion gap: 11 (ref 5–15)
BUN: 26 mg/dL — ABNORMAL HIGH (ref 6–20)
CO2: 25 mmol/L (ref 22–32)
Calcium: 9 mg/dL (ref 8.9–10.3)
Chloride: 101 mmol/L (ref 98–111)
Creatinine, Ser: 1.56 mg/dL — ABNORMAL HIGH (ref 0.44–1.00)
GFR, Estimated: 40 mL/min — ABNORMAL LOW (ref >60)
Glucose, Bld: 131 mg/dL — ABNORMAL HIGH (ref 70–99)
Potassium: 4 mmol/L (ref 3.5–5.1)
Sodium: 137 mmol/L (ref 135–145)

## 2022-08-28 LAB — CBC WITH DIFFERENTIAL/PLATELET
Abs Immature Granulocytes: 0.06 10*3/uL (ref 0.00–0.07)
Basophils Absolute: 0 10*3/uL (ref 0.0–0.1)
Basophils Relative: 0 %
Eosinophils Absolute: 0.4 10*3/uL (ref 0.0–0.5)
Eosinophils Relative: 4 %
HCT: 41.4 % (ref 36.0–46.0)
Hemoglobin: 12.3 g/dL (ref 12.0–15.0)
Immature Granulocytes: 1 %
Lymphocytes Relative: 22 %
Lymphs Abs: 2.3 10*3/uL (ref 0.7–4.0)
MCH: 22.9 pg — ABNORMAL LOW (ref 26.0–34.0)
MCHC: 29.7 g/dL — ABNORMAL LOW (ref 30.0–36.0)
MCV: 77.2 fL — ABNORMAL LOW (ref 80.0–100.0)
Monocytes Absolute: 1 10*3/uL (ref 0.1–1.0)
Monocytes Relative: 10 %
Neutro Abs: 6.7 10*3/uL (ref 1.7–7.7)
Neutrophils Relative %: 63 %
Platelets: 338 10*3/uL (ref 150–400)
RBC: 5.36 MIL/uL — ABNORMAL HIGH (ref 3.87–5.11)
RDW: 19.2 % — ABNORMAL HIGH (ref 11.5–15.5)
WBC: 10.5 10*3/uL (ref 4.0–10.5)
nRBC: 0 % (ref 0.0–0.2)

## 2022-08-28 LAB — GLUCOSE, CAPILLARY
Glucose-Capillary: 122 mg/dL — ABNORMAL HIGH (ref 70–99)
Glucose-Capillary: 106 mg/dL — ABNORMAL HIGH (ref 70–99)
Glucose-Capillary: 78 mg/dL (ref 70–99)
Glucose-Capillary: 97 mg/dL (ref 70–99)

## 2022-08-28 LAB — HEPARIN LEVEL (UNFRACTIONATED): Heparin Unfractionated: 0.63 IU/mL (ref 0.30–0.70)

## 2022-08-28 MED ORDER — QUETIAPINE FUMARATE 25 MG PO TABS
25.0000 mg | ORAL_TABLET | Freq: Every day | ORAL | Status: DC
  Administered 2022-08-28: 25 mg via ORAL
  Filled 2022-08-28: qty 1

## 2022-08-28 MED ORDER — HYDROXYZINE HCL 10 MG PO TABS
10.0000 mg | ORAL_TABLET | Freq: Three times a day (TID) | ORAL | Status: DC | PRN
  Administered 2022-08-28: 10 mg via ORAL
  Filled 2022-08-28: qty 1

## 2022-08-28 MED ORDER — ATORVASTATIN CALCIUM 10 MG PO TABS
20.0000 mg | ORAL_TABLET | Freq: Every day | ORAL | Status: DC
  Administered 2022-08-28 – 2022-08-29 (×2): 20 mg via ORAL
  Filled 2022-08-28 (×2): qty 2

## 2022-08-28 MED ORDER — METOCLOPRAMIDE HCL 5 MG/ML IJ SOLN
5.0000 mg | Freq: Three times a day (TID) | INTRAMUSCULAR | Status: DC
  Administered 2022-08-28: 5 mg via INTRAVENOUS
  Filled 2022-08-28: qty 2

## 2022-08-28 NOTE — TOC Progression Note (Signed)
Transition of Care [facility]) - Progression Note    Patient Details  Name: Morgan May MRN: [id number] Date of Birth: 09/04/1970  [facility] CM/SW Contact  Zenon Mayo, RN Phone Number: 08/28/2022, 5:12 PM  Clinical Narrative:    from home, abd pian, chest pain, synocpe,  for dm2, conts on ivf's, indep.  TOC following.        Expected Discharge Plan and Services                                               Social Determinants of Health (SDOH) Interventions SDOH Screenings   Food Insecurity: No Food Insecurity (08/27/2022)  Housing: Low Risk  (08/27/2022)  Transportation Needs: No Transportation Needs (08/27/2022)  Utilities: Not At Risk (08/27/2022)  Tobacco Use: Low Risk  (08/27/2022)    Readmission Risk Interventions     No data to display

## 2022-08-28 NOTE — Progress Notes (Signed)
ANTICOAGULATION CONSULT NOTE - Follow Up Consult  Pharmacy Consult for Heparin Indication: suspicion for pulmonary embolus  No Known Allergies  Patient Measurements: Height: 5' 11"$  (180.3 cm) Weight: 107.5 kg (236 lb 14.4 oz) IBW/kg (Calculated) : 70.8 Heparin Dosing Weight: 95 kg  Vital Signs: Temp: 98.2 F (36.8 C) (02/12 0752) Temp Source: Oral (02/12 0752) BP: 120/68 (02/12 0752) Pulse Rate: 65 (02/12 0752)  Labs: Recent Labs    08/27/22 0152 08/27/22 1217 08/28/22 0053  HGB 12.5  --  12.3  HCT 41.2  --  41.4  PLT 346  --  338  LABPROT 11.9  --   --   INR 0.9  --   --   HEPARINUNFRC  --  0.43 0.63  CREATININE 2.15*  --  1.56*  TROPONINIHS 11  --   --     Estimated Creatinine Clearance: 56.3 mL/min (A) (by C-G formula based on SCr of 1.56 mg/dL (H)).  Assessment: 52 yr old female admitted 2/11 with chest pain and shortness of breath. Pharmacy consulted for IV heparin dosing for suspicion of PE. D-dimer 0.52.  AKI on admit and CTA deferred. Noted possible plan for VQ scan.  Heparin level remains therapeutic (0.63) on 1700 units/hr. CBC stable.  Goal of Therapy:  Heparin level 0.3-0.7 units/ml Monitor platelets by anticoagulation protocol: Yes   Plan:  Continue heparin drip at 1700 units/hr Daily heparin level and CBC. Monitor for signs/symptoms of bleeding. Follow up further studies and anticoagulation plans.  Arty Baumgartner, RPh 08/28/2022,9:12 AM

## 2022-08-28 NOTE — Progress Notes (Signed)
PROGRESS NOTE    Morgan May  [id number] DOB: 1970/07/19 DOA: 08/27/2022 PCP: [facility]    Brief Narrative:  Morgan May is a 52 y.o. female with medical history significant  T2DM, HTN, HLD, anxiety who presents from outside ED with chest pain and concerns of pulmonary embolism.    About 2 days ago she began to note RUQ abdominal pain radiating to her mid-back causing her to feel dizzy and lightheaded at work. Thought it was hypoglycemia but checked sugar was around 80s. Then had a brief syncopal episode and  loss of consciousness for a few seconds at work. Continued to have pain in her sleep that night. Pain is worse with breathing and slightly worse with food. Has feeling of early satiety with decrease intake. Has been constipation but normally has one regular bowel movement daily. Has noticed "almost" black stool.    Has first dose of Ozempic 6 days ago. Denies tobacco use. Occasional alcohol use. Quit use of cocaine last summer.    Assessment and Plan: * Abdominal pain -pt initially presented as a transfer from [facility] ED with chest pain concern due secondary to pulmonary embolism with mildly elevated D-dimer of 0.52.  -upon arrival- However pain she describes is more RUQ abdominal pain with radiation to her back with symptoms of early satiety, constipation-- had first dose of Ozempic -trial of reglan IV ATC -RUQ ultrasound done and shows some mild fatty liver -However still cannot completely rule out pulmonary embolism without imaging and she cannot receive contrast due to AKI-- will continue IVF today and check CTA in AM if Cr stable-- continue heparin for now  Anxiety -Continue nighttime Seroquel -prn hydroxyzine   HTN (hypertension) -mildly elevated due to pain -Hold home ACE and thiazide due to AKI  HLD (hyperlipidemia) -Continue statin   Type 2 diabetes mellitus (HCC) - A1C of 6.9 -keep on SSI  AKI (acute kidney injury)  ([facility]) -creatinine of 2.15 from prior of 1.22.   -IVF  Hyponatremia -resolved  Syncope and collapse -suspect vasovagal from her abdominal pain and dehydration     DVT prophylaxis:     Code Status: Full Code   Disposition Plan:  Level of care: Telemetry Medical Status is: Inpatient Remains inpatient appropriate    Consultants:     Subjective: No fever, no chills  Objective: Vitals:   08/27/22 1954 08/28/22 0010 08/28/22 0600 08/28/22 0752  BP: (!) 141/67 (!) 172/81 119/64 120/68  Pulse: (!) 57 64 68 65  Resp: 19 19 19 20  $ Temp: (!) 97.5 F (36.4 C) (!) 97.5 F (36.4 C) (!) 97.5 F (36.4 C) 98.2 F (36.8 C)  TempSrc: Oral Oral Oral Oral  SpO2: 97% 90% 96% 97%  Weight:  107.5 kg    Height:        Intake/Output Summary (Last 24 hours) at 08/28/2022 1111 Last data filed at 08/28/2022 0754 Gross per 24 hour  Intake 2171.63 ml  Output --  Net 2171.63 ml   Filed Weights   08/27/22 0123 08/27/22 1802 08/28/22 0010  Weight: 104.3 kg 107.2 kg 107.5 kg    Examination:   General: Appearance:    Obese female in no acute distress     Lungs:      respirations unlabored  Heart:    Normal heart rate.   MS:   All extremities are intact.    Neurologic:   Awake, alert       Data Reviewed: I have personally reviewed  following labs and imaging studies  CBC: Recent Labs  Lab 08/27/22 0152 08/28/22 0053  WBC 14.2* 10.5  NEUTROABS  --  6.7  HGB 12.5 12.3  HCT 41.2 41.4  MCV 74.8* 77.2*  PLT 346 [id number]   Basic Metabolic Panel: Recent Labs  Lab 08/27/22 0152 08/28/22 0053  NA 132* 137  K 3.5 4.0  CL 100 101  CO2 24 25  GLUCOSE 169* 131*  BUN 43* 26*  CREATININE 2.15* 1.56*  CALCIUM 8.7* 9.0   GFR: Estimated Creatinine Clearance: 56.3 mL/min (A) (by C-G formula based on SCr of 1.56 mg/dL (H)). Liver Function Tests: Recent Labs  Lab 08/27/22 1942  AST 26  ALT 12  ALKPHOS 86  BILITOT 0.5  PROT 7.9  ALBUMIN 3.7   Recent Labs  Lab  08/27/22 1942  LIPASE 43   No results for input(s): "AMMONIA" in the last 168 hours. Coagulation Profile: Recent Labs  Lab 08/27/22 0152  INR 0.9   Cardiac Enzymes: No results for input(s): "CKTOTAL", "CKMB", "CKMBINDEX", "TROPONINI" in the last 168 hours. BNP (last 3 results) No results for input(s): "PROBNP" in the last 8760 hours. HbA1C: Recent Labs    08/27/22 0152  HGBA1C 6.9*   CBG: Recent Labs  Lab 08/27/22 0749 08/27/22 1214 08/27/22 1820 08/27/22 2056 08/28/22 0559  GLUCAP 87 81 115* 120* 122*   Lipid Profile: No results for input(s): "CHOL", "HDL", "LDLCALC", "TRIG", "CHOLHDL", "LDLDIRECT" in the last 72 hours. Thyroid Function Tests: No results for input(s): "TSH", "T4TOTAL", "FREET4", "T3FREE", "THYROIDAB" in the last 72 hours. Anemia Panel: No results for input(s): "VITAMINB12", "FOLATE", "FERRITIN", "TIBC", "IRON", "RETICCTPCT" in the last 72 hours. Sepsis Labs: No results for input(s): "PROCALCITON", "LATICACIDVEN" in the last 168 hours.  Recent Results (from the past 240 hour(s))  Resp panel by RT-PCR (RSV, Flu A&B, Covid) Nasopharyngeal Swab     Status: None   Collection Time: 08/27/22  1:52 AM   Specimen: Nasopharyngeal Swab; Nasal Swab  Result Value Ref Range Status   SARS Coronavirus 2 by RT PCR NEGATIVE NEGATIVE Final    Comment: (NOTE) SARS-CoV-2 target nucleic acids are NOT DETECTED.  The SARS-CoV-2 RNA is generally detectable in upper respiratory specimens during the acute phase of infection. The lowest concentration of SARS-CoV-2 viral copies this assay can detect is 138 copies/mL. A negative result does not preclude SARS-Cov-2 infection and should not be used as the sole basis for treatment or other patient management decisions. A negative result may occur with  improper specimen collection/handling, submission of specimen other than nasopharyngeal swab, presence of viral mutation(s) within the areas targeted by this assay, and  inadequate number of viral copies(<138 copies/mL). A negative result must be combined with clinical observations, patient history, and epidemiological information. The expected result is Negative.  Fact Sheet for Patients:  EntrepreneurPulse.com.au  Fact Sheet for Healthcare Providers:  IncredibleEmployment.be  This test is no t yet approved or cleared by the [place] FDA and  has been authorized for detection and/or diagnosis of SARS-CoV-2 by FDA under an Emergency Use Authorization (EUA). This EUA will remain  in effect (meaning this test can be used) for the duration of the COVID-19 declaration under Section 564(b)(1) of the Act, 21 U.S.C.section 360bbb-3(b)(1), unless the authorization is terminated  or revoked sooner.       Influenza A by PCR NEGATIVE NEGATIVE Final   Influenza B by PCR NEGATIVE NEGATIVE Final    Comment: (NOTE) The Xpert Xpress SARS-CoV-2/FLU/RSV plus assay is intended  as an aid in the diagnosis of influenza from Nasopharyngeal swab specimens and should not be used as a sole basis for treatment. Nasal washings and aspirates are unacceptable for Xpert Xpress SARS-CoV-2/FLU/RSV testing.  Fact Sheet for Patients: EntrepreneurPulse.com.au  Fact Sheet for Healthcare Providers: IncredibleEmployment.be  This test is not yet approved or cleared by the [place] FDA and has been authorized for detection and/or diagnosis of SARS-CoV-2 by FDA under an Emergency Use Authorization (EUA). This EUA will remain in effect (meaning this test can be used) for the duration of the COVID-19 declaration under Section 564(b)(1) of the Act, 21 U.S.C. section 360bbb-3(b)(1), unless the authorization is terminated or revoked.     Resp Syncytial Virus by PCR NEGATIVE NEGATIVE Final    Comment: (NOTE) Fact Sheet for Patients: EntrepreneurPulse.com.au  Fact Sheet for Healthcare  Providers: IncredibleEmployment.be  This test is not yet approved or cleared by the [place] FDA and has been authorized for detection and/or diagnosis of SARS-CoV-2 by FDA under an Emergency Use Authorization (EUA). This EUA will remain in effect (meaning this test can be used) for the duration of the COVID-19 declaration under Section 564(b)(1) of the Act, 21 U.S.C. section 360bbb-3(b)(1), unless the authorization is terminated or revoked.  Performed at [facility], [address], [place], [place] [address]          Radiology Studies: US Abdomen Limited RUQ (LIVER/GB)  Result Date: 08/28/2022 CLINICAL DATA:  52 year old female with abdominal pain. EXAM: ULTRASOUND ABDOMEN LIMITED RIGHT UPPER QUADRANT COMPARISON:  CTA Chest, Abdomen, and Pelvis 03/05/2021. FINDINGS: Gallbladder: No gallstones or wall thickening visualized. No sonographic Murphy sign noted by sonographer. Common bile duct: Diameter: 2 mm, normal. Liver: Echogenicity at the upper limits of normal (image 25) to increased (image 28). No discrete liver lesion. No intrahepatic biliary ductal dilatation. Portal vein is patent on color Doppler imaging with normal direction of blood flow towards the liver. Other: Negative visible right kidney.  No free fluid. IMPRESSION: 1. Normal gallbladder.  No evidence of bile duct obstruction. 2. Borderline to mild hepatic steatosis. Electronically Signed   By: Genevie Ann M.D.   On: 08/28/2022 06:31   CT Chest Wo Contrast  Result Date: 08/27/2022 CLINICAL DATA:  Chest pain, dyspnea EXAM: CT CHEST WITHOUT CONTRAST TECHNIQUE: Multidetector CT imaging of the chest was performed following the standard protocol without IV contrast. RADIATION DOSE REDUCTION: This exam was performed according to the departmental dose-optimization program which includes automated exposure control, adjustment of the mA and/or kV according to patient size and/or use of iterative  reconstruction technique. COMPARISON:  03/05/2021 FINDINGS: Cardiovascular: No significant vascular findings. Normal heart size. No pericardial effusion. Mediastinum/Nodes: No enlarged mediastinal or axillary lymph nodes. Thyroid gland, trachea, and esophagus demonstrate no significant findings. Lungs/Pleura: Lungs are clear. No pleural effusion or pneumothorax. Upper Abdomen: No acute abnormality. Musculoskeletal: No acute bone abnormality. Bulky, flowing osteophytes are seen within the thoracic spine in keeping with changes of diffuse idiopathic skeletal hyperostosis. No lytic or blastic bone lesion. IMPRESSION: 1. No acute intrathoracic pathology identified. No definite radiographic explanation for the patient's reported symptoms. Electronically Signed   By: Fidela Salisbury M.D.   On: 08/27/2022 03:48   CT HEAD WO CONTRAST (5MM)  Result Date: 08/27/2022 CLINICAL DATA:  Head trauma, moderate-severe EXAM: CT HEAD WITHOUT CONTRAST TECHNIQUE: Contiguous axial images were obtained from the base of the skull through the vertex without intravenous contrast. RADIATION DOSE REDUCTION: This exam was performed according to the  departmental dose-optimization program which includes automated exposure control, adjustment of the mA and/or kV according to patient size and/or use of iterative reconstruction technique. COMPARISON:  CT head 04/03/2016 FINDINGS: Brain: No evidence of large-territorial acute infarction. No parenchymal hemorrhage. No mass lesion. No extra-axial collection. No mass effect or midline shift. No hydrocephalus. Basilar cisterns are patent. Vascular: No hyperdense vessel. Skull: No acute fracture or focal lesion. Sinuses/Orbits: Paranasal sinuses and mastoid air cells are clear. The orbits are unremarkable. Other: None. IMPRESSION: No acute intracranial abnormality. Electronically Signed   By: Iven Finn M.D.   On: 08/27/2022 03:19   DG Chest 2 View  Result Date: 08/27/2022 CLINICAL DATA:  Chest  pain, back pain or shortness of breath. EXAM: CHEST - 2 VIEW COMPARISON:  PA and lateral 03/05/2021 FINDINGS: The heart size and mediastinal contours are within normal limits. Both lungs are clear. 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 IMPRESSION: No active cardiopulmonary disease.  Stable chest. Electronically Signed   By: Telford Nab M.D.   On: 08/27/2022 02:30        Scheduled Meds:  insulin aspart  0-15 Units Subcutaneous TID WC   insulin aspart  0-5 Units Subcutaneous QHS   metoCLOPramide (REGLAN) injection  5 mg Intravenous Q8H   QUEtiapine  25 mg Oral QHS   Continuous Infusions:  sodium chloride 75 mL/hr at 08/28/22 0803   heparin 1,700 Units/hr (08/28/22 0805)     LOS: 1 day    Time spent: 45 minutes spent on chart review, discussion with nursing staff, consultants, updating family and interview/physical exam; more than 50% of that time was spent in counseling and/or coordination of care.    Geradine Girt, DO [facility] Available via Epic secure chat 7am-7pm After these hours, please refer to coverage provider listed on [email or web address] 08/28/2022, 11:11 AM

## 2022-08-29 ENCOUNTER — Inpatient Hospital Stay (HOSPITAL_COMMUNITY): Payer: Medicaid Other

## 2022-08-29 DIAGNOSIS — N179 Acute kidney failure, unspecified: Secondary | ICD-10-CM | POA: Diagnosis not present

## 2022-08-29 DIAGNOSIS — E1165 Type 2 diabetes mellitus with hyperglycemia: Secondary | ICD-10-CM | POA: Diagnosis not present

## 2022-08-29 LAB — CBC
HCT: 40.7 % (ref 36.0–46.0)
Hemoglobin: 12.6 g/dL (ref 12.0–15.0)
MCH: 23.1 pg — ABNORMAL LOW (ref 26.0–34.0)
MCHC: 31 g/dL (ref 30.0–36.0)
MCV: 74.7 fL — ABNORMAL LOW (ref 80.0–100.0)
Platelets: 321 10*3/uL (ref 150–400)
RBC: 5.45 MIL/uL — ABNORMAL HIGH (ref 3.87–5.11)
RDW: 19.2 % — ABNORMAL HIGH (ref 11.5–15.5)
WBC: 11.2 10*3/uL — ABNORMAL HIGH (ref 4.0–10.5)
nRBC: 0 % (ref 0.0–0.2)

## 2022-08-29 LAB — GLUCOSE, CAPILLARY
Glucose-Capillary: 112 mg/dL — ABNORMAL HIGH (ref 70–99)
Glucose-Capillary: 115 mg/dL — ABNORMAL HIGH (ref 70–99)

## 2022-08-29 LAB — BASIC METABOLIC PANEL
Anion gap: 10 (ref 5–15)
BUN: 22 mg/dL — ABNORMAL HIGH (ref 6–20)
CO2: 25 mmol/L (ref 22–32)
Calcium: 9.2 mg/dL (ref 8.9–10.3)
Chloride: 103 mmol/L (ref 98–111)
Creatinine, Ser: 1.25 mg/dL — ABNORMAL HIGH (ref 0.44–1.00)
GFR, Estimated: 52 mL/min — ABNORMAL LOW (ref >60)
Glucose, Bld: 112 mg/dL — ABNORMAL HIGH (ref 70–99)
Potassium: 3.8 mmol/L (ref 3.5–5.1)
Sodium: 138 mmol/L (ref 135–145)

## 2022-08-29 LAB — HEPARIN LEVEL (UNFRACTIONATED): Heparin Unfractionated: 0.63 IU/mL (ref 0.30–0.70)

## 2022-08-29 MED ORDER — ORAL CARE MOUTH RINSE: 15.0000 mL | OROMUCOSAL | Status: DC | PRN

## 2022-08-29 MED ORDER — IOHEXOL 350 MG/ML SOLN
75.0000 mL | Freq: Once | INTRAVENOUS | Status: AC | PRN
  Administered 2022-08-29: 75 mL via INTRAVENOUS
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
## 2022-08-29 NOTE — Progress Notes (Signed)
ANTICOAGULATION CONSULT NOTE - Follow Up Consult  Pharmacy Consult for Heparin Indication: suspicion for pulmonary embolus  No Known Allergies  Patient Measurements: Height: 5' 11"$  (180.3 cm) Weight: 107.3 kg (236 lb 8 oz) IBW/kg (Calculated) : 70.8 Heparin Dosing Weight: 95 kg  Vital Signs: Temp: 98.3 F (36.8 C) (02/13 0424) Temp Source: Oral (02/13 0424) BP: 151/88 (02/13 0424) Pulse Rate: 72 (02/13 0424)  Labs: Recent Labs    08/27/22 0152 08/27/22 1217 08/28/22 0053 08/29/22 0033  HGB 12.5  --  12.3 12.6  HCT 41.2  --  41.4 40.7  PLT 346  --  338 321  LABPROT 11.9  --   --   --   INR 0.9  --   --   --   HEPARINUNFRC  --  0.43 0.63 0.63  CREATININE 2.15*  --  1.56* 1.25*  TROPONINIHS 11  --   --   --      Estimated Creatinine Clearance: 70.2 mL/min (A) (by C-G formula based on SCr of 1.25 mg/dL (H)).  Assessment: 51 yr old female admitted 2/11 with chest pain and shortness of breath. Pharmacy consulted for IV heparin dosing for suspicion of PE. D-dimer 0.52.  CT w/o contrast showed no acute intrathoracic process. AKI on admit and CT with contrast deferred - now planning for 2/13.  Heparin level remains therapeutic (0.63) on 1700 units/hr. CBC stable.  Goal of Therapy:  Heparin level 0.3-0.7 units/ml Monitor platelets by anticoagulation protocol: Yes   Plan:  Continue heparin drip at 1700 units/hr Daily heparin level and CBC Monitor for signs/symptoms of bleeding Follow up further studies and anticoagulation plans  Sherlon Handing, PharmD, BCPS Please see amion for complete clinical pharmacist phone list 08/29/2022,7:16 AM

## 2023-08-07 ENCOUNTER — Encounter (HOSPITAL_BASED_OUTPATIENT_CLINIC_OR_DEPARTMENT_OTHER): Payer: Self-pay

## 2023-08-07 ENCOUNTER — Emergency Department (HOSPITAL_BASED_OUTPATIENT_CLINIC_OR_DEPARTMENT_OTHER)
Admission: EM | Admit: 2023-08-07 | Discharge: 2023-08-07 | Disposition: A | Discharge status: Home / Self Care | Payer: Medicaid Other | Attending: Emergency Medicine | Admitting: Emergency Medicine

## 2023-08-07 ENCOUNTER — Other Ambulatory Visit: Payer: Self-pay

## 2023-08-07 ENCOUNTER — Emergency Department (HOSPITAL_BASED_OUTPATIENT_CLINIC_OR_DEPARTMENT_OTHER): Payer: Medicaid Other

## 2023-08-07 ENCOUNTER — Other Ambulatory Visit (HOSPITAL_BASED_OUTPATIENT_CLINIC_OR_DEPARTMENT_OTHER): Payer: Self-pay

## 2023-08-07 DIAGNOSIS — R059 Cough, unspecified: Secondary | ICD-10-CM | POA: Diagnosis present

## 2023-08-07 DIAGNOSIS — U071 COVID-19: Secondary | ICD-10-CM | POA: Insufficient documentation

## 2023-08-07 LAB — RESP PANEL BY RT-PCR (RSV, FLU A&B, COVID)  RVPGX2
Influenza A by PCR: NEGATIVE
Influenza B by PCR: NEGATIVE
Resp Syncytial Virus by PCR: NEGATIVE
SARS Coronavirus 2 by RT PCR: POSITIVE — AB

## 2023-08-07 LAB — GROUP A STREP BY PCR: Group A Strep by PCR: NOT DETECTED

## 2023-08-07 MED ORDER — PAXLOVID (300/100) 20 X 150 MG & 10 X 100MG PO TBPK
3.0000 | ORAL_TABLET | Freq: Two times a day (BID) | ORAL | 0 refills | Status: DC
  Filled 2023-08-07: qty 30, 5d supply, fill #0

## 2023-08-07 MED ORDER — PAXLOVID (150/100) 10 X 150 MG & 10 X 100MG PO TBPK
2.0000 | ORAL_TABLET | Freq: Two times a day (BID) | ORAL | 0 refills | Status: DC
  Filled 2023-08-07: qty 20, 5d supply, fill #0

## 2023-08-07 MED ORDER — PAXLOVID (150/100) 10 X 150 MG & 10 X 100MG PO TBPK: 2.0000 | ORAL_TABLET | Freq: Two times a day (BID) | ORAL | 0 refills | Status: DC

## 2023-08-07 MED ORDER — DEXAMETHASONE 4 MG PO TABS
10.0000 mg | ORAL_TABLET | Freq: Once | ORAL | Status: AC
  Administered 2023-08-07: 10 mg via ORAL
  Filled 2023-08-07: qty 3

## 2023-08-07 MED ORDER — PAXLOVID (150/100) 10 X 150 MG & 10 X 100MG PO TBPK: 2.0000 | ORAL_TABLET | Freq: Two times a day (BID) | ORAL | 0 refills | Status: AC

## 2023-08-07 NOTE — ED Provider Notes (Signed)
[place] [facility] Provider Note   CSN: [id number] Arrival date & time: 08/07/23  1001     History  Chief Complaint  Patient presents with   Cough   Chest Pain    Morgan May is a 53 y.o. female.  Patient here with cough and congestion for the last 2 to 3 days.  Nothing makes it worse or better.  A lot of congestion cough nasal pain.  History of hypertension diabetes.  No history of asthma or smoking.  Denies any weakness numbness tingling.  Body aches.  The history is provided by the patient.       Home Medications Prior to Admission medications   Medication Sig Start Date End Date Taking? Authorizing Provider  nirmatrelvir/ritonavir, renal dosing, (PAXLOVID, 150/100,) 10 x 150 MG & 10 x 100MG  TBPK Take 2 tablets by mouth 2 (two) times daily for 5 days. Dosage for moderate renal impairment (eGFR >/= 30 to <60 mL/min): 150 mg nirmatrelvir (one 150 mg tablet) with 100 mg ritonavir (one 100 mg tablet), with both tablets taken together twice daily for 5 days. Not recommended if eGFR < 30 mL/min.  PAXLOVID is not recommend in patients with severe hepatic impairment (Child-Pugh Class C). 08/07/23 08/12/23 Yes Shernita Rabinovich, DO  atorvastatin (LIPITOR) 20 MG tablet Take 20 mg by mouth daily. 08/15/22   [provider]  chlorthalidone (HYGROTON) 50 MG tablet Take 50 mg by mouth daily. 08/15/22 02/11/23  [provider]  ferrous sulfate 325 (65 FE) MG EC tablet Take 325 mg by mouth daily.    [provider]  glimepiride (AMARYL) 2 MG tablet Take 2 mg by mouth daily. 08/15/22 02/11/23  [provider]  ibuprofen (ADVIL) 800 MG tablet Take 800 mg by mouth as needed for moderate pain or mild pain.    [provider]  JANUVIA 100 MG tablet Take 100 mg by mouth daily. 08/15/22 08/15/23  [provider]  lisinopril (ZESTRIL) 40 MG tablet Take 40 mg by mouth daily.    [provider]  OZEMPIC, 1  MG/DOSE, 4 MG/3ML SOPN Inject 1 mg into the skin once a week. 08/15/22 08/15/23  [provider]  QUEtiapine (SEROQUEL) 25 MG tablet Take 25 mg by mouth at bedtime.    [provider]      Allergies    Patient has no known allergies.    Review of Systems   Review of Systems  Physical Exam Updated Vital Signs BP (!) 160/112   Pulse 93   Temp 98.6 F (37 C) (Oral)   Resp 20   Wt 100.7 kg   SpO2 100%   BMI 30.96 kg/m  Physical Exam Vitals and nursing note reviewed.  Constitutional:      General: She is not in acute distress.    Appearance: She is well-developed. She is not ill-appearing.  HENT:     Head: Normocephalic and atraumatic.  Eyes:     Extraocular Movements: Extraocular movements intact.     Conjunctiva/sclera: Conjunctivae normal.     Pupils: Pupils are equal, round, and reactive to light.  Cardiovascular:     Rate and Rhythm: Normal rate and regular rhythm.     Pulses:          Radial pulses are 2+ on the right side and 2+ on the left side.     Heart sounds: Normal heart sounds. No murmur heard. Pulmonary:     Effort: Pulmonary effort is normal.  No respiratory distress.     Breath sounds: Normal breath sounds.  Abdominal:     Palpations: Abdomen is soft.     Tenderness: There is no abdominal tenderness.  Musculoskeletal:        General: No swelling.     Cervical back: Normal range of motion and neck supple.  Skin:    General: Skin is warm and dry.     Capillary Refill: Capillary refill takes less than 2 seconds.  Neurological:     Mental Status: She is alert.  Psychiatric:        Mood and Affect: Mood normal.     ED Results / Procedures / Treatments   Labs (all labs ordered are listed, but only abnormal results are displayed) Labs Reviewed  RESP PANEL BY RT-PCR (RSV, FLU A&B, COVID)  [id number] - Abnormal; Notable for the following components:      Result Value   SARS Coronavirus 2 by RT PCR POSITIVE (*)    All other components within  normal limits  GROUP A STREP BY PCR    EKG EKG Interpretation Date/Time:  Tuesday August 07 2023 10:52:14 EST Ventricular Rate:  83 PR Interval:  151 QRS Duration:  85 QT Interval:  383 QTC Calculation: 450 R Axis:   73  Text Interpretation: Sinus rhythm Probable left atrial enlargement Confirmed by Virgina Norfolk [phone]) on 08/07/2023 11:02:00 AM  Radiology DG Chest 2 View Result Date: 08/07/2023 CLINICAL DATA:  Congestion. Body aches. Headache. Dry cough. Hoarseness. EXAM: CHEST - 2 VIEW COMPARISON:  Chest radiographs 08/27/2022 FINDINGS: Cardiac silhouette and mediastinal contours are within normal limits. The lungs are clear. No pleural effusion or pneumothorax. Multilevel bridging thoracic spine enthesophytes with preservation of the disc spaces, compatible with diffuse idiopathic skeletal hyperostosis (DISH). IMPRESSION: 1. No active cardiopulmonary disease. Electronically Signed   By: Neita Garnet M.D.   On: 08/07/2023 11:02    Procedures Procedures    Medications Ordered in ED Medications  dexamethasone (DECADRON) tablet 10 mg (has no administration in time range)    ED Course/ Medical Decision Making/ A&P                                 Medical Decision Making Amount and/or Complexity of Data Reviewed Radiology: ordered.  Risk Prescription drug management.   Morgan May is here with cough and congestion.  History of diabetes hypertension.  Differential diagnosis likely COVID flu RSV versus less likely pneumonia.  Have no concern for ACS or PE given history and physical and vitals.  EKG shows sinus rhythm.  No ischemic changes.  Overall COVID test was positive.  Chest x-ray negative for pneumonia.  I reviewed interpreted labs and imaging.  She is interested in taking Paxlovid and will dose and gave her dose of Decadron to help with congestion and cough.  Discharged in good condition.  Understands return precautions.  This chart was dictated using voice  recognition software.  Despite best efforts to proofread,  errors can occur which can change the documentation meaning.         Final Clinical Impression(s) / ED Diagnoses Final diagnoses:  COVID-19    Rx / DC Orders ED Discharge Orders          Ordered    nirmatrelvir/ritonavir (PAXLOVID, 300/100,) 20 x 150 MG & 10 x 100MG  TBPK  2 times daily,   Status:  Discontinued  08/07/23 1133    nirmatrelvir/ritonavir, renal dosing, (PAXLOVID, 150/100,) 10 x 150 MG & 10 x 100MG  TBPK  2 times daily        08/07/23 1134              [place], DO 08/07/23 1136

## 2023-08-07 NOTE — ED Notes (Signed)
Reviewed discharge medications meds, and follow up with pt . Pt states understanding Ambulatory at time of discharge

## 2023-08-07 NOTE — ED Triage Notes (Signed)
Pt reports hoarse x 2 days. Woke up yesterday with body aches and HA. Dry cough . Reports pain across chest and back. SOB
